# Patient Record
Sex: Female | Born: 1960 | Race: White | Hispanic: No | Marital: Married | Smoking: Never smoker
Health system: Southern US, Community
[De-identification: ages and names within clinical notes are randomized; demographics above are authoritative.]

## PROBLEM LIST (undated history)

## (undated) DIAGNOSIS — E119 Type 2 diabetes mellitus without complications: Secondary | ICD-10-CM

## (undated) DIAGNOSIS — I10 Essential (primary) hypertension: Secondary | ICD-10-CM

## (undated) DIAGNOSIS — E7800 Pure hypercholesterolemia, unspecified: Secondary | ICD-10-CM

## (undated) HISTORY — PX: CHOLECYSTECTOMY: SHX55

---

## 2000-09-30 ENCOUNTER — Inpatient Hospital Stay (HOSPITAL_COMMUNITY): Admission: EM | Admit: 2000-09-30 | Discharge: 2000-10-05 | Payer: Self-pay | Admitting: Emergency Medicine

## 2013-07-20 ENCOUNTER — Telehealth: Payer: Self-pay

## 2013-07-20 NOTE — Telephone Encounter (Signed)
Pt was referred by Dr. Sherryll Burger for screening colonoscopy. LMOM for a return call.

## 2013-07-21 ENCOUNTER — Telehealth: Payer: Self-pay

## 2013-07-21 NOTE — Telephone Encounter (Signed)
Pt was returning DS call. Please call patient at [phone]

## 2013-07-22 NOTE — Telephone Encounter (Signed)
LMOM for a return call.  

## 2013-07-27 NOTE — Telephone Encounter (Signed)
Pt left VM and I returned her call and LMOM for a call back.

## 2013-07-28 NOTE — Telephone Encounter (Signed)
See phone call of 07/20/2013.

## 2013-08-04 ENCOUNTER — Telehealth: Payer: Self-pay

## 2013-08-04 NOTE — Telephone Encounter (Signed)
LMOM for pt to call to schedule colonoscopy.

## 2013-08-20 NOTE — Telephone Encounter (Signed)
Letter to PCP

## 2015-11-27 ENCOUNTER — Encounter (HOSPITAL_COMMUNITY): Payer: Self-pay | Admitting: Emergency Medicine

## 2015-11-27 ENCOUNTER — Inpatient Hospital Stay (HOSPITAL_COMMUNITY)
Admission: EM | Admit: 2015-11-27 | Discharge: 2015-11-29 | DRG: 871 | Disposition: A | Discharge status: Home / Self Care | Payer: BLUE CROSS/BLUE SHIELD | Attending: Internal Medicine | Admitting: Internal Medicine

## 2015-11-27 ENCOUNTER — Emergency Department (HOSPITAL_COMMUNITY): Payer: BLUE CROSS/BLUE SHIELD

## 2015-11-27 DIAGNOSIS — E876 Hypokalemia: Secondary | ICD-10-CM

## 2015-11-27 DIAGNOSIS — N183 Chronic kidney disease, stage 3 (moderate): Secondary | ICD-10-CM | POA: Diagnosis present

## 2015-11-27 DIAGNOSIS — I129 Hypertensive chronic kidney disease with stage 1 through stage 4 chronic kidney disease, or unspecified chronic kidney disease: Secondary | ICD-10-CM | POA: Diagnosis present

## 2015-11-27 DIAGNOSIS — E7800 Pure hypercholesterolemia, unspecified: Secondary | ICD-10-CM | POA: Diagnosis present

## 2015-11-27 DIAGNOSIS — Z833 Family history of diabetes mellitus: Secondary | ICD-10-CM | POA: Diagnosis not present

## 2015-11-27 DIAGNOSIS — E785 Hyperlipidemia, unspecified: Secondary | ICD-10-CM | POA: Diagnosis present

## 2015-11-27 DIAGNOSIS — E1121 Type 2 diabetes mellitus with diabetic nephropathy: Secondary | ICD-10-CM | POA: Diagnosis not present

## 2015-11-27 DIAGNOSIS — A419 Sepsis, unspecified organism: Secondary | ICD-10-CM | POA: Diagnosis present

## 2015-11-27 DIAGNOSIS — Z23 Encounter for immunization: Secondary | ICD-10-CM

## 2015-11-27 DIAGNOSIS — E1165 Type 2 diabetes mellitus with hyperglycemia: Secondary | ICD-10-CM | POA: Diagnosis present

## 2015-11-27 DIAGNOSIS — I10 Essential (primary) hypertension: Secondary | ICD-10-CM | POA: Diagnosis not present

## 2015-11-27 DIAGNOSIS — E1129 Type 2 diabetes mellitus with other diabetic kidney complication: Secondary | ICD-10-CM

## 2015-11-27 DIAGNOSIS — J189 Pneumonia, unspecified organism: Secondary | ICD-10-CM

## 2015-11-27 DIAGNOSIS — Z6837 Body mass index (BMI) 37.0-37.9, adult: Secondary | ICD-10-CM

## 2015-11-27 DIAGNOSIS — R05 Cough: Secondary | ICD-10-CM | POA: Diagnosis not present

## 2015-11-27 DIAGNOSIS — E1122 Type 2 diabetes mellitus with diabetic chronic kidney disease: Secondary | ICD-10-CM | POA: Diagnosis present

## 2015-11-27 DIAGNOSIS — J9601 Acute respiratory failure with hypoxia: Secondary | ICD-10-CM | POA: Diagnosis not present

## 2015-11-27 DIAGNOSIS — Z7984 Long term (current) use of oral hypoglycemic drugs: Secondary | ICD-10-CM | POA: Diagnosis not present

## 2015-11-27 DIAGNOSIS — Z7982 Long term (current) use of aspirin: Secondary | ICD-10-CM

## 2015-11-27 DIAGNOSIS — N181 Chronic kidney disease, stage 1: Secondary | ICD-10-CM

## 2015-11-27 DIAGNOSIS — R059 Cough, unspecified: Secondary | ICD-10-CM

## 2015-11-27 HISTORY — DX: Type 2 diabetes mellitus without complications: E11.9

## 2015-11-27 HISTORY — DX: Pure hypercholesterolemia, unspecified: E78.00

## 2015-11-27 HISTORY — DX: Essential (primary) hypertension: I10

## 2015-11-27 LAB — COMPREHENSIVE METABOLIC PANEL
ALT: 16 U/L (ref 14–54)
ALT: 17 U/L (ref 14–54)
ANION GAP: 15 (ref 5–15)
AST: 20 U/L (ref 15–41)
AST: 17 U/L (ref 15–41)
Albumin: 2.9 g/dL — ABNORMAL LOW (ref 3.5–5.0)
Albumin: 2.6 g/dL — ABNORMAL LOW (ref 3.5–5.0)
Alkaline Phosphatase: 100 U/L (ref 38–126)
Alkaline Phosphatase: 93 U/L (ref 38–126)
Anion gap: 14 (ref 5–15)
BUN: 30 mg/dL — AB (ref 6–20)
BUN: 28 mg/dL — ABNORMAL HIGH (ref 6–20)
CHLORIDE: 86 mmol/L — AB (ref 101–111)
CHLORIDE: 91 mmol/L — AB (ref 101–111)
CO2: 34 mmol/L — ABNORMAL HIGH (ref 22–32)
CO2: 30 mmol/L (ref 22–32)
CREATININE: 1.42 mg/dL — AB (ref 0.44–1.00)
CREATININE: 1.31 mg/dL — AB (ref 0.44–1.00)
Calcium: 7.4 mg/dL — ABNORMAL LOW (ref 8.9–10.3)
Calcium: 7 mg/dL — ABNORMAL LOW (ref 8.9–10.3)
GFR calc Af Amer: 47 mL/min — ABNORMAL LOW (ref >60)
GFR, EST AFRICAN AMERICAN: 51 mL/min — AB (ref >60)
GFR, EST NON AFRICAN AMERICAN: 40 mL/min — AB (ref >60)
GFR, EST NON AFRICAN AMERICAN: 44 mL/min — AB (ref >60)
Glucose, Bld: 295 mg/dL — ABNORMAL HIGH (ref 65–99)
Glucose, Bld: 344 mg/dL — ABNORMAL HIGH (ref 65–99)
POTASSIUM: 2.9 mmol/L — AB (ref 3.5–5.1)
Potassium: 2.8 mmol/L — ABNORMAL LOW (ref 3.5–5.1)
SODIUM: 136 mmol/L (ref 135–145)
Sodium: 134 mmol/L — ABNORMAL LOW (ref 135–145)
Total Bilirubin: 0.6 mg/dL (ref 0.3–1.2)
Total Bilirubin: 0.8 mg/dL (ref 0.3–1.2)
Total Protein: 7.1 g/dL (ref 6.5–8.1)
Total Protein: 6.8 g/dL (ref 6.5–8.1)

## 2015-11-27 LAB — CBC
HCT: 33.1 % — ABNORMAL LOW (ref 36.0–46.0)
Hemoglobin: 10.9 g/dL — ABNORMAL LOW (ref 12.0–15.0)
MCH: 26.8 pg (ref 26.0–34.0)
MCHC: 32.9 g/dL (ref 30.0–36.0)
MCV: 81.3 fL (ref 78.0–100.0)
PLATELETS: 207 10*3/uL (ref 150–400)
RBC: 4.07 MIL/uL (ref 3.87–5.11)
RDW: 13.7 % (ref 11.5–15.5)
WBC: 22.9 10*3/uL — ABNORMAL HIGH (ref 4.0–10.5)

## 2015-11-27 LAB — BLOOD GAS, ARTERIAL
Acid-Base Excess: 6 mmol/L — ABNORMAL HIGH (ref 0.0–2.0)
Bicarbonate: 29.8 mEq/L — ABNORMAL HIGH (ref 20.0–24.0)
DRAWN BY: 25788
O2 CONTENT: 3 L/min
O2 SAT: 90.7 %
PO2 ART: 61.9 mmHg — AB (ref 80.0–100.0)
pCO2 arterial: 39 mmHg (ref 35.0–45.0)
pH, Arterial: 7.491 — ABNORMAL HIGH (ref 7.350–7.450)

## 2015-11-27 LAB — URINALYSIS, ROUTINE W REFLEX MICROSCOPIC
Bilirubin Urine: NEGATIVE
LEUKOCYTES UA: NEGATIVE
Nitrite: NEGATIVE
Protein, ur: 30 mg/dL — AB
pH: 6 (ref 5.0–8.0)

## 2015-11-27 LAB — CBC WITH DIFFERENTIAL/PLATELET
Basophils Absolute: 0 10*3/uL (ref 0.0–0.1)
Basophils Relative: 0 %
EOS ABS: 0 10*3/uL (ref 0.0–0.7)
EOS PCT: 0 %
HCT: 34.4 % — ABNORMAL LOW (ref 36.0–46.0)
Hemoglobin: 11.5 g/dL — ABNORMAL LOW (ref 12.0–15.0)
LYMPHS PCT: 3 %
Lymphs Abs: 0.5 10*3/uL — ABNORMAL LOW (ref 0.7–4.0)
MCH: 27.4 pg (ref 26.0–34.0)
MCHC: 33.4 g/dL (ref 30.0–36.0)
MCV: 82.1 fL (ref 78.0–100.0)
Monocytes Absolute: 0.3 10*3/uL (ref 0.1–1.0)
Monocytes Relative: 2 %
Neutro Abs: 17.6 10*3/uL — ABNORMAL HIGH (ref 1.7–7.7)
Neutrophils Relative %: 95 %
PLATELETS: 222 10*3/uL (ref 150–400)
RBC: 4.19 MIL/uL (ref 3.87–5.11)
RDW: 14.1 % (ref 11.5–15.5)
WBC: 18.5 10*3/uL — AB (ref 4.0–10.5)

## 2015-11-27 LAB — APTT: APTT: 30 s (ref 24–37)

## 2015-11-27 LAB — URINE MICROSCOPIC-ADD ON

## 2015-11-27 LAB — I-STAT CG4 LACTIC ACID, ED: Lactic Acid, Venous: 3.08 mmol/L (ref 0.5–2.0)

## 2015-11-27 LAB — MRSA PCR SCREENING: MRSA by PCR: NEGATIVE

## 2015-11-27 LAB — PROTIME-INR
INR: 1.31 (ref 0.00–1.49)
INR: 1.21 (ref 0.00–1.49)
PROTHROMBIN TIME: 15.5 s — AB (ref 11.6–15.2)
Prothrombin Time: 16.5 seconds — ABNORMAL HIGH (ref 11.6–15.2)

## 2015-11-27 LAB — LACTIC ACID, PLASMA
LACTIC ACID, VENOUS: 1.8 mmol/L (ref 0.5–2.0)
LACTIC ACID, VENOUS: 1.6 mmol/L (ref 0.5–2.0)

## 2015-11-27 LAB — MAGNESIUM: Magnesium: 1.8 mg/dL (ref 1.7–2.4)

## 2015-11-27 LAB — BRAIN NATRIURETIC PEPTIDE: B NATRIURETIC PEPTIDE 5: 270 pg/mL — AB (ref 0.0–100.0)

## 2015-11-27 LAB — STREP PNEUMONIAE URINARY ANTIGEN: STREP PNEUMO URINARY ANTIGEN: NEGATIVE

## 2015-11-27 LAB — GLUCOSE, CAPILLARY
GLUCOSE-CAPILLARY: 276 mg/dL — AB (ref 65–99)
GLUCOSE-CAPILLARY: 358 mg/dL — AB (ref 65–99)

## 2015-11-27 LAB — PROCALCITONIN: PROCALCITONIN: 14.69 ng/mL

## 2015-11-27 MED ORDER — METHYLPREDNISOLONE SODIUM SUCC 125 MG IJ SOLR: 125.0000 mg | Freq: Once | INTRAMUSCULAR | Status: DC

## 2015-11-27 MED ORDER — SODIUM CHLORIDE 0.9 % IV SOLN
INTRAVENOUS | Status: DC
  Administered 2015-11-27: 125 mL/h via INTRAVENOUS

## 2015-11-27 MED ORDER — INSULIN ASPART 100 UNIT/ML ~~LOC~~ SOLN
0.0000 [IU] | Freq: Every day | SUBCUTANEOUS | Status: DC
  Administered 2015-11-27 – 2015-11-28 (×2): 5 [IU] via SUBCUTANEOUS

## 2015-11-27 MED ORDER — SODIUM CHLORIDE 0.9 % IV BOLUS (SEPSIS)
1000.0000 mL | INTRAVENOUS | Status: AC
Start: 2015-11-27 — End: 2015-11-27

## 2015-11-27 MED ORDER — GLIPIZIDE ER 5 MG PO TB24
10.0000 mg | ORAL_TABLET | Freq: Every day | ORAL | Status: DC
  Administered 2015-11-27 – 2015-11-29 (×3): 10 mg via ORAL
  Filled 2015-11-27 (×3): qty 2

## 2015-11-27 MED ORDER — SODIUM CHLORIDE 0.9 % IV BOLUS (SEPSIS)
1000.0000 mL | Freq: Once | INTRAVENOUS | Status: AC
  Administered 2015-11-27: 1000 mL via INTRAVENOUS

## 2015-11-27 MED ORDER — POTASSIUM CHLORIDE CRYS ER 20 MEQ PO TBCR
20.0000 meq | EXTENDED_RELEASE_TABLET | Freq: Once | ORAL | Status: AC
  Administered 2015-11-27: 20 meq via ORAL
  Filled 2015-11-27: qty 1

## 2015-11-27 MED ORDER — METOPROLOL TARTRATE 50 MG PO TABS
100.0000 mg | ORAL_TABLET | Freq: Two times a day (BID) | ORAL | Status: DC
  Administered 2015-11-27 – 2015-11-29 (×4): 100 mg via ORAL
  Filled 2015-11-27 (×4): qty 2

## 2015-11-27 MED ORDER — LISINOPRIL 10 MG PO TABS
40.0000 mg | ORAL_TABLET | Freq: Every day | ORAL | Status: DC
  Administered 2015-11-27 – 2015-11-29 (×3): 40 mg via ORAL
  Filled 2015-11-27 (×3): qty 4

## 2015-11-27 MED ORDER — ACETAMINOPHEN 325 MG PO TABS
650.0000 mg | ORAL_TABLET | Freq: Once | ORAL | Status: AC
  Administered 2015-11-27: 650 mg via ORAL
  Filled 2015-11-27: qty 2

## 2015-11-27 MED ORDER — CALCIUM CARBONATE-VITAMIN D 500-200 MG-UNIT PO TABS
1.0000 | ORAL_TABLET | Freq: Two times a day (BID) | ORAL | Status: DC
  Administered 2015-11-27 – 2015-11-29 (×4): 1 via ORAL
  Filled 2015-11-27 (×9): qty 1

## 2015-11-27 MED ORDER — POTASSIUM CHLORIDE CRYS ER 20 MEQ PO TBCR
40.0000 meq | EXTENDED_RELEASE_TABLET | ORAL | Status: AC
  Administered 2015-11-27 (×3): 40 meq via ORAL
  Filled 2015-11-27 (×2): qty 2

## 2015-11-27 MED ORDER — SENNOSIDES-DOCUSATE SODIUM 8.6-50 MG PO TABS: 1.0000 | ORAL_TABLET | Freq: Every evening | ORAL | Status: DC | PRN

## 2015-11-27 MED ORDER — CEFTRIAXONE SODIUM 1 G IJ SOLR
1.0000 g | INTRAMUSCULAR | Status: DC
  Administered 2015-11-27 – 2015-11-28 (×2): 1 g via INTRAVENOUS
  Filled 2015-11-27 (×3): qty 10

## 2015-11-27 MED ORDER — ACETAMINOPHEN 325 MG PO TABS: 650.0000 mg | ORAL_TABLET | Freq: Four times a day (QID) | ORAL | Status: DC | PRN

## 2015-11-27 MED ORDER — PNEUMOCOCCAL VAC POLYVALENT 25 MCG/0.5ML IJ INJ: 0.5000 mL | INJECTION | INTRAMUSCULAR | Status: DC

## 2015-11-27 MED ORDER — DEXTROSE 5 % IV SOLN
500.0000 mg | Freq: Once | INTRAVENOUS | Status: AC
  Administered 2015-11-27: 500 mg via INTRAVENOUS
  Filled 2015-11-27: qty 500

## 2015-11-27 MED ORDER — ONDANSETRON HCL 4 MG PO TABS: 4.0000 mg | ORAL_TABLET | Freq: Four times a day (QID) | ORAL | Status: DC | PRN

## 2015-11-27 MED ORDER — DEXTROSE 5 % IV SOLN
1.0000 g | Freq: Once | INTRAVENOUS | Status: AC
  Administered 2015-11-27: 1 g via INTRAVENOUS
  Filled 2015-11-27: qty 10

## 2015-11-27 MED ORDER — IPRATROPIUM-ALBUTEROL 0.5-2.5 (3) MG/3ML IN SOLN
3.0000 mL | Freq: Once | RESPIRATORY_TRACT | Status: AC
  Administered 2015-11-27: 3 mL via RESPIRATORY_TRACT
  Filled 2015-11-27: qty 3

## 2015-11-27 MED ORDER — SODIUM CHLORIDE 0.9 % IV SOLN
INTRAVENOUS | Status: DC
  Administered 2015-11-27: 800 mL via INTRAVENOUS

## 2015-11-27 MED ORDER — DEXTROSE 5 % IV SOLN
500.0000 mg | INTRAVENOUS | Status: DC
  Administered 2015-11-28: 500 mg via INTRAVENOUS
  Filled 2015-11-27 (×3): qty 500

## 2015-11-27 MED ORDER — ACETAMINOPHEN 650 MG RE SUPP: 650.0000 mg | Freq: Four times a day (QID) | RECTAL | Status: DC | PRN

## 2015-11-27 MED ORDER — INSULIN ASPART 100 UNIT/ML ~~LOC~~ SOLN
4.0000 [IU] | Freq: Three times a day (TID) | SUBCUTANEOUS | Status: DC
  Administered 2015-11-28 – 2015-11-29 (×5): 4 [IU] via SUBCUTANEOUS

## 2015-11-27 MED ORDER — ENOXAPARIN SODIUM 40 MG/0.4ML ~~LOC~~ SOLN
40.0000 mg | SUBCUTANEOUS | Status: DC
  Administered 2015-11-27 – 2015-11-28 (×2): 40 mg via SUBCUTANEOUS
  Filled 2015-11-27 (×2): qty 0.4

## 2015-11-27 MED ORDER — CANAGLIFLOZIN 300 MG PO TABS: 300.0000 mg | ORAL_TABLET | Freq: Every day | ORAL | Status: DC

## 2015-11-27 MED ORDER — ONDANSETRON HCL 4 MG/2ML IJ SOLN: 4.0000 mg | Freq: Four times a day (QID) | INTRAMUSCULAR | Status: DC | PRN

## 2015-11-27 MED ORDER — INSULIN ASPART 100 UNIT/ML ~~LOC~~ SOLN
0.0000 [IU] | Freq: Three times a day (TID) | SUBCUTANEOUS | Status: DC
  Administered 2015-11-27: 8 [IU] via SUBCUTANEOUS
  Administered 2015-11-28: 5 [IU] via SUBCUTANEOUS
  Administered 2015-11-28: 15 [IU] via SUBCUTANEOUS
  Administered 2015-11-28 – 2015-11-29 (×2): 5 [IU] via SUBCUTANEOUS

## 2015-11-27 MED ORDER — DEXTROSE 5 % IV SOLN
INTRAVENOUS | Status: AC
  Filled 2015-11-27: qty 10

## 2015-11-27 MED ORDER — ATORVASTATIN CALCIUM 10 MG PO TABS
10.0000 mg | ORAL_TABLET | Freq: Every day | ORAL | Status: DC
  Administered 2015-11-27 – 2015-11-29 (×3): 10 mg via ORAL
  Filled 2015-11-27 (×3): qty 1

## 2015-11-27 MED ORDER — SODIUM CHLORIDE 0.9 % IV BOLUS (SEPSIS)
1000.0000 mL | INTRAVENOUS | Status: AC
  Administered 2015-11-27 (×3): 1000 mL via INTRAVENOUS

## 2015-11-27 MED ORDER — AMLODIPINE BESYLATE 5 MG PO TABS
10.0000 mg | ORAL_TABLET | Freq: Every day | ORAL | Status: DC
  Administered 2015-11-27 – 2015-11-29 (×3): 10 mg via ORAL
  Filled 2015-11-27 (×3): qty 2

## 2015-11-27 MED ORDER — ASPIRIN EC 81 MG PO TBEC
81.0000 mg | DELAYED_RELEASE_TABLET | Freq: Every day | ORAL | Status: DC
  Administered 2015-11-27 – 2015-11-29 (×3): 81 mg via ORAL
  Filled 2015-11-27 (×3): qty 1

## 2015-11-27 MED ORDER — INFLUENZA VAC SPLIT QUAD 0.5 ML IM SUSY: 0.5000 mL | PREFILLED_SYRINGE | INTRAMUSCULAR | Status: DC

## 2015-11-27 MED ORDER — HYDROCHLOROTHIAZIDE 25 MG PO TABS
25.0000 mg | ORAL_TABLET | Freq: Every day | ORAL | Status: DC
  Administered 2015-11-27 – 2015-11-29 (×3): 25 mg via ORAL
  Filled 2015-11-27 (×3): qty 1

## 2015-11-27 NOTE — ED Notes (Signed)
Patient brought in via EMS from home. Alert and oriented. Airway patent. Patient c/o shortness of breath with cough, body aches, and fevers. Patient reports vomiting once but states she was never nauseated just coughing. Patient given  solumedrol IV, duoneb, and bolus in route to hospital by EMS. EMS reports patient'slood sugar as 324. Per patient grandson is recently recovering from flu. EMS reports patient showing A-fib on monitor, in which patient denies hx of A-fib.

## 2015-11-27 NOTE — ED Notes (Signed)
Repeat lactic not due at this time.

## 2015-11-27 NOTE — ED Notes (Signed)
Respiratory notified of need for peak flow check and Duoneb.  No peak flow meters available per Heather.

## 2015-11-27 NOTE — ED Notes (Signed)
NT states that Lactic acid was ran initially but machines are having trouble crossing over.  Test repeated.

## 2015-11-27 NOTE — H&P (Signed)
[facility]          History and Physical    PCP:   No primary care provider on file.   EDP: Pamela Fry, M.D.  Chief Complaint:  Shortness of breath, cough  HPI: Patient is a 55 year old woman with history of hypertension, type 2 diabetes, hyperlipidemia, morbid obesity who presents to the hospital today with a week and a half duration of shortness of breath and cough. She decided to come to the hospital today because this has not improved. Through the course of her illness she has also had fevers and chills although she has not actually taken her temperature. 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 She has not traveled out of state recently. In the emergency department on chest x-ray she was found to have a consolidated right middle lobe pneumonia. She is found to be hypoxemic with O2 sats of 87% on room air. Labs show a potassium of 2.8, a creatinine of 1.4 which appears to be close to her baseline, a lactic acid that is elevated at 3.08 and a WBC count of 22.9. Admission has been requested for further evaluation and management.  Allergies:  No Known Allergies    Past Medical History  Diagnosis Date  . Hypertension   . Diabetes mellitus without complication ([place])   . High cholesterol     Past Surgical History  Procedure Laterality Date  . Cesarean section    . Cholecystectomy      Prior to Admission medications   Medication Sig Start Date End Date Taking? Authorizing Provider  amLODipine (NORVASC) 10 MG tablet Take 1 tablet by mouth daily. 11/16/15  Yes Historical Provider, MD  aspirin EC 81 MG tablet Take 81 mg by mouth daily.   Yes Historical Provider, MD  atorvastatin (LIPITOR) 10 MG tablet Take 1 tablet by mouth daily. 11/18/15  Yes Historical Provider, MD  calcium-vitamin D (OSCAL WITH D) 500-200 MG-UNIT tablet Take 1 tablet by mouth 2 (two) times daily.   Yes Historical Provider, MD  glipiZIDE  (GLUCOTROL XL) 10 MG 24 hr tablet Take 1 tablet by mouth daily. 11/16/15  Yes Historical Provider, MD  hydrochlorothiazide (HYDRODIURIL) 25 MG tablet Take 1 tablet by mouth daily. 11/16/15  Yes Historical Provider, MD  INVOKANA 300 MG TABS tablet Take 1 tablet by mouth daily. 11/18/15  Yes Historical Provider, MD  lisinopril (PRINIVIL,ZESTRIL) 40 MG tablet Take 1 tablet by mouth daily. 11/16/15  Yes Historical Provider, MD  metFORMIN (GLUCOPHAGE) 500 MG tablet Take 2 tablets by mouth 2 (two) times daily. 11/16/15  Yes Historical Provider, MD  metoprolol (LOPRESSOR) 100 MG tablet Take 1 tablet by mouth 2 (two) times daily. 11/16/15  Yes Historical Provider, MD  VICTOZA 18 MG/3ML SOPN Inject 1.8 mg into the skin daily. 11/16/15  Yes Historical Provider, MD    Social History:  reports that she has never smoked. She has never used smokeless tobacco. She reports that she does not drink alcohol or use illicit drugs.  Family History  Problem Relation Age of Onset  . Diabetes Mother     Review of Systems:  Constitutional: Denies  diaphoresis, appetite change and fatigue.  HEENT: Denies photophobia, eye pain, redness, hearing loss, ear pain, congestion, sore throat, rhinorrhea, sneezing, mouth sores, trouble swallowing, neck pain, neck stiffness and tinnitus.   Respiratory: Denies, chest tightness,  and wheezing.  Cardiovascular: Denies chest pain, palpitations and leg swelling.  Gastrointestinal: Denies nausea, vomiting, abdominal pain, diarrhea, constipation, blood in stool and abdominal distention.  Genitourinary: Denies dysuria, urgency, frequency, hematuria, flank pain and difficulty urinating.  Endocrine: Denies: hot or cold intolerance, sweats, changes in hair or nails, polyuria, polydipsia. Musculoskeletal: Denies myalgias, back pain, joint swelling, arthralgias and gait problem.  Skin: Denies pallor, rash and wound.  Neurological: Denies dizziness, seizures, syncope, weakness, light-headedness,  numbness and headaches.  Hematological: Denies adenopathy. Easy bruising, personal or family bleeding history  Psychiatric/Behavioral: Denies suicidal ideation, mood changes, confusion, nervousness, sleep disturbance and agitation   Physical Exam: Blood pressure 132/56, pulse 110, temperature 98.2 F (36.8 C), temperature source Oral, resp. rate 20, height 5' (1.524 m), weight 86.183 kg (190 lb), SpO2 98 %. General: Alert, awake, oriented 3 HEENT: Normocephalic, atraumatic, pupils equal round and reactive to light, extraocular movements intact, poor dentition, dry mucous membranes Neck: Supple, no JVD, no lymphadenopathy, no bruits, no goiter Cardiovascular: Regular rate and rhythm, no murmurs, rubs or gallops Lungs: Right mid lung field crackles and rails, no wheezes Abdomen: Obese, soft, nontender, nondistended, positive bowel sounds Extremities: 1+ pitting edema bilaterally Neurologic: Grossly intact and nonfocal  Labs on Admission:  Results for orders placed or performed during the hospital encounter of 11/27/15 (from the past 48 hour(s))  Comprehensive metabolic panel     Status: Abnormal   Collection Time: 11/27/15  9:25 AM  Result Value Ref Range   Sodium 134 (L) 135 - 145 mmol/L   Potassium 2.8 (L) 3.5 - 5.1 mmol/L   Chloride 86 (L) 101 - 111 mmol/L   CO2 34 (H) 22 - 32 mmol/L   Glucose, Bld 295 (H) 65 - 99 mg/dL   BUN 30 (H) 6 - 20 mg/dL   Creatinine, Ser 1.42 (H) 0.44 - 1.00 mg/dL   Calcium 7.4 (L) 8.9 - 10.3 mg/dL   Total Protein 7.1 6.5 - 8.1 g/dL   Albumin 2.9 (L) 3.5 - 5.0 g/dL   AST 20 15 - 41 U/L   ALT 16 14 - 54 U/L   Alkaline Phosphatase 100 38 - 126 U/L   Total Bilirubin 0.6 0.3 - 1.2 mg/dL   GFR calc non Af Amer 40 (L) >60 mL/min   GFR calc Af Amer 47 (L) >60 mL/min    Comment: (NOTE) The eGFR has been calculated using the CKD EPI equation. This calculation has not been validated in all clinical situations. eGFR's persistently <60 mL/min signify possible  Chronic Kidney Disease.    Anion gap 14 5 - 15  Brain natriuretic peptide (only with dyspnea)     Status: Abnormal   Collection Time: 11/27/15  9:25 AM  Result Value Ref Range   B Natriuretic Peptide 270.0 (H) 0.0 - 100.0 pg/mL  CBC     Status: Abnormal   Collection Time: 11/27/15  9:25 AM  Result Value Ref Range   WBC 22.9 (H) 4.0 - 10.5 K/uL   RBC 4.07 3.87 - 5.11 MIL/uL   Hemoglobin 10.9 (L) 12.0 - 15.0 g/dL   HCT 33.1 (L) 36.0 - 46.0 %   MCV 81.3 78.0 - 100.0 fL   MCH 26.8 26.0 - 34.0 pg   MCHC 32.9 30.0 - 36.0 g/dL   RDW 13.7 11.5 - 15.5 %   Platelets 207 150 - 400 K/uL  Protime-INR     Status: Abnormal   Collection Time: 11/27/15  9:25 AM  Result Value Ref Range   Prothrombin  Time 16.5 (H) 11.6 - 15.2 seconds   INR 1.31 0.00 - 1.49  Blood Culture (routine x 2)     Status: None (Preliminary result)   Collection Time: 11/27/15 11:11 AM  Result Value Ref Range   Specimen Description BLOOD    Special Requests NONE    Culture NO GROWTH < 12 HOURS    Report Status PENDING   Blood Culture (routine x 2)     Status: None (Preliminary result)   Collection Time: 11/27/15 11:14 AM  Result Value Ref Range   Specimen Description BLOOD    Special Requests NONE    Culture NO GROWTH < 12 HOURS    Report Status PENDING   Urinalysis, Routine w reflex microscopic (not at [facility])     Status: Abnormal   Collection Time: 11/27/15 11:19 AM  Result Value Ref Range   Color, Urine YELLOW YELLOW   APPearance CLOUDY (A) CLEAR   Specific Gravity, Urine <1.005 (L) 1.005 - 1.030   pH 6.0 5.0 - 8.0   Glucose, UA >1000 (A) NEGATIVE mg/dL   Hgb urine dipstick MODERATE (A) NEGATIVE   Bilirubin Urine NEGATIVE NEGATIVE   Ketones, ur TRACE (A) NEGATIVE mg/dL   Protein, ur 30 (A) NEGATIVE mg/dL   Nitrite NEGATIVE NEGATIVE   Leukocytes, UA NEGATIVE NEGATIVE  Urine microscopic-add on     Status: Abnormal   Collection Time: 11/27/15 11:19 AM  Result Value Ref Range   Squamous Epithelial / LPF 6-30  (A) NONE SEEN   WBC, UA 0-5 0 - 5 WBC/hpf   RBC / HPF 0-5 0 - 5 RBC/hpf   Bacteria, UA FEW (A) NONE SEEN   Urine-Other YEAST PRESENT   I-Stat CG4 Lactic Acid, ED     Status: Abnormal   Collection Time: 11/27/15  1:31 PM  Result Value Ref Range   Lactic Acid, Venous 3.08 (HH) 0.5 - 2.0 mmol/L   Comment NOTIFIED PHYSICIAN   Blood gas, arterial ([facility] & [facility] ONLY)     Status: Abnormal   Collection Time: 11/27/15  2:10 PM  Result Value Ref Range   O2 Content 3.0 L/min   pH, Arterial 7.491 (H) 7.350 - 7.450   pCO2 arterial 39.0 35.0 - 45.0 mmHg   pO2, Arterial 61.9 (L) 80.0 - 100.0 mmHg   Bicarbonate 29.8 (H) 20.0 - 24.0 mEq/L   Acid-Base Excess 6.0 (H) 0.0 - 2.0 mmol/L   O2 Saturation 90.7 %   Collection site RIGHT RADIAL    Drawn by [id number]    Sample type ARTERIAL    Allens test (pass/fail) PASS PASS  Glucose, capillary     Status: Abnormal   Collection Time: 11/27/15  3:31 PM  Result Value Ref Range   Glucose-Capillary 276 (H) 65 - 99 mg/dL    Radiological Exams on Admission: Dg Chest Port 1 View  11/27/2015  CLINICAL DATA:  Shortness of breath with cough and fever EXAM: PORTABLE CHEST 1 VIEW COMPARISON:  None. FINDINGS: There is extensive airspace consolidation throughout the right mid lung. Left lung clear. Heart is enlarged with pulmonary vascular within normal limits. No adenopathy evident. No bone lesion. IMPRESSION: Large focus of airspace consolidation in the right mid lung region. Suspect pneumonia, although underlying mass lesion cannot be excluded. Lungs elsewhere clear. Heart enlarged. Followup PA and lateral chest radiographs recommended in 3-4 weeks following trial of antibiotic therapy to ensure resolution and exclude underlying malignancy. Electronically Signed   By: Lowella Grip III M.D.   On: 11/27/2015  10:03    Assessment/Plan Principal Problem:   CAP (community acquired pneumonia) Active Problems:   Sepsis ([place])   Acute hypoxemic respiratory failure ([place])    Morbid obesity ([place])   HTN (hypertension)   DM type 2 causing renal disease ([place])   Hyperlipidemia   Hypokalemia    Sepsis -Secondary to community-acquired pneumonia -We'll give fluids as per sepsis protocol, recheck lactic acid and pro-calcitonin as per protocol as well. -Please see below for details.  Community-acquired pneumonia -Obtain sputum/blood cultures. -Check strep pneumo/legionella urinary antigens. -Start on Rocephin and azithromycin for community-acquired coverage.  Acute hypoxemic respiratory failure -With O2 saturations initially of 87% on room air, she is oxygen nave. -Plan to continue oxygen as needed. -As tolerated. -Secondary to pneumonia.  Morbid obesity -Noted  Type 2 diabetes -Check hemoglobin A1c. -Continue oral hypoglycemics with the exception of metformin which will be held, sliding scale insulin.  Hypokalemia -We'll replete orally, check magnesium level and replace as necessary.  Chronic kidney disease stage III -Likely related to history of diabetes and hypertension. -Creatinine is at baseline of 1.2-1.4.  DVT prophylaxis -Lovenox  CODE STATUS -Full code    Time Spent on Admission: 95 minutes  [facility] Pager: [phone] 11/27/2015, 4:18 PM

## 2015-11-27 NOTE — ED Provider Notes (Addendum)
CSN: [id number]     Arrival date & time 11/27/15  7829 History  By signing my name below, I, Arianna Nassar, attest that this documentation has been prepared under the direction and in the presence of Margarita Grizzle, MD. Electronically Signed: Octavia Heir, ED Scribe. 11/27/2015. 9:36 AM.     Chief Complaint  Patient presents with  . Shortness of Breath      Patient is a 55 y.o. female presenting with shortness of breath. The history is provided by the patient. No language interpreter was used.  Shortness of Breath Severity:  Moderate Onset quality:  Sudden Duration:  3 days Timing:  Constant Progression:  Worsening Chronicity:  New Context: URI   Relieved by:  Nothing Worsened by:  Nothing tried Ineffective treatments:  Inhaler Associated symptoms: cough and wheezing   Associated symptoms: no fever and no sore throat    HPI Comments: Pamela Fry is a 55 y.o. female who  HTN, high cholesterol and DM presents to the Emergency Department complaining of constant, gradual worsening, moderate, cough with associated shortness of breath, chills, generalized body aches, congestion, and loss of appetite onset 3 days ago. She takes lisinopril, metformin, aspirin and other medications daily. She has been around her grandson who has the flu. Denies fever and sore throat. Pt did not receive her flu shot this year. Past Medical History  Diagnosis Date  . Hypertension   . Diabetes mellitus without complication ([facility])   . High cholesterol    Past Surgical History  Procedure Laterality Date  . Cesarean section    . Cholecystectomy     Family History  Problem Relation Age of Onset  . Diabetes Mother    Social History  Substance Use Topics  . Smoking status: Never Smoker   . Smokeless tobacco: Never Used  . Alcohol Use: No   OB History    Gravida Para Term Preterm AB TAB SAB Ectopic Multiple Living   Review of Systems  Constitutional: Negative for fever.  HENT:  Negative for sore throat.   Respiratory: Positive for cough, shortness of breath and wheezing.       Allergies  Review of patient's allergies indicates no known allergies.  Home Medications   Prior to Admission medications   Not on File   Triage vitals: BP 135/58 mmHg  Pulse 128  Temp(Src) 97.7 F (36.5 C) (Oral)  Resp 28  Ht 5' (1.524 m)  Wt 190 lb (86.183 kg)  BMI 37.11 kg/m2  SpO2 85% Physical Exam  Constitutional: She is oriented to person, place, and time. She appears well-developed and well-nourished.  HENT:  Head: Normocephalic and atraumatic.  Right Ear: Tympanic membrane and external ear normal.  Left Ear: Tympanic membrane and external ear normal.  Nose: Nose normal. Right sinus exhibits no maxillary sinus tenderness and no frontal sinus tenderness. Left sinus exhibits no maxillary sinus tenderness and no frontal sinus tenderness.  Eyes: Conjunctivae and EOM are normal. Pupils are equal, round, and reactive to light. Right eye exhibits no nystagmus. Left eye exhibits no nystagmus.  Neck: Normal range of motion. Neck supple.  Cardiovascular: Normal heart sounds and intact distal pulses.  An irregular rhythm present. Tachycardia present.   Pulmonary/Chest: Effort normal. No respiratory distress. She has wheezes. She has rhonchi. She exhibits no tenderness.  Decent air movement, rhonchi with expiratory wheezes  Abdominal: Soft. Bowel sounds are normal. She exhibits no distension and  no mass. There is no tenderness.  Musculoskeletal: Normal range of motion. She exhibits no edema or tenderness.  Neurological: She is alert and oriented to person, place, and time. She has normal strength and normal reflexes. No sensory deficit. She displays a negative Romberg sign. GCS eye subscore is 4. GCS verbal subscore is 5. GCS motor subscore is 6.  Reflex Scores:      Tricep reflexes are 2+ on the right side and 2+ on the left side.      Bicep reflexes are 2+ on the right side and 2+  on the left side.      Brachioradialis reflexes are 2+ on the right side and 2+ on the left side.      Patellar reflexes are 2+ on the right side and 2+ on the left side.      Achilles reflexes are 2+ on the right side and 2+ on the left side. Patient with normal gait without ataxia, shuffling, spasm, or antalgia. Speech is normal without dysarthria, dysphasia, or aphasia. Muscle strength is 5/5 in bilateral shoulders, elbow flexor and extensors, wrist flexor and extensors, and intrinsic hand muscles. 5/5 bilateral lower extremity hip flexors, extensors, knee flexors and extensors, and ankle dorsi and plantar flexors.    Skin: Skin is warm and dry. No rash noted.  Psychiatric: She has a normal mood and affect. Her behavior is normal. Judgment and thought content normal.  Nursing note and vitals reviewed.   ED Course  Procedures  DIAGNOSTIC STUDIES: Oxygen Saturation is 85% on RA, low by my interpretation.  COORDINATION OF CARE:  9:34 AM Discussed treatment plan with pt at bedside and pt agreed to plan.  Labs Review Labs Reviewed  COMPREHENSIVE METABOLIC PANEL - Abnormal; Notable for the following:    Sodium 134 (*)    Potassium 2.8 (*)    Chloride 86 (*)    CO2 34 (*)    Glucose, Bld 295 (*)    BUN 30 (*)    Creatinine, Ser 1.42 (*)    Calcium 7.4 (*)    Albumin 2.9 (*)    GFR calc non Af Amer 40 (*)    GFR calc Af Amer 47 (*)    All other components within normal limits  BRAIN NATRIURETIC PEPTIDE - Abnormal; Notable for the following:    B Natriuretic Peptide 270.0 (*)    All other components within normal limits  CBC - Abnormal; Notable for the following:    WBC 22.9 (*)    Hemoglobin 10.9 (*)    HCT 33.1 (*)    All other components within normal limits  PROTIME-INR - Abnormal; Notable for the following:    Prothrombin Time 16.5 (*)    All other components within normal limits  CULTURE, BLOOD (ROUTINE X 2)  CULTURE, BLOOD (ROUTINE X 2)  URINALYSIS, ROUTINE W REFLEX  MICROSCOPIC (NOT AT [facility])  I-STAT TROPOININ, ED  I-STAT CG4 LACTIC ACID, ED  I-STAT CG4 LACTIC ACID, ED    Imaging Review Dg Chest Port 1 View  11/27/2015  CLINICAL DATA:  Shortness of breath with cough and fever EXAM: PORTABLE CHEST 1 VIEW COMPARISON:  None. FINDINGS: There is extensive airspace consolidation throughout the right mid lung. Left lung clear. Heart is enlarged with pulmonary vascular within normal limits. No adenopathy evident. No bone lesion. IMPRESSION: Large focus of airspace consolidation in the right mid lung region. Suspect pneumonia, although underlying mass lesion cannot be excluded. Lungs elsewhere clear. Heart enlarged. Followup PA and  lateral chest radiographs recommended in 3-4 weeks following trial of antibiotic therapy to ensure resolution and exclude underlying malignancy. Electronically Signed   By: Bretta Bang III M.D.   On: 11/27/2015 10:03   I have personally reviewed and evaluated these images and lab results as part of my medical decision-making.   EKG Interpretation   Date/Time:  Sunday November 27 2015 09:17:13 EST Ventricular Rate:  125 PR Interval:    QRS Duration: 108 QT Interval:  377 QTC Calculation: 544 R Axis:   116 Text Interpretation:  Atrial fibrillation Right axis deviation Low  voltage, precordial leads Borderline repolarization abnormality Prolonged  QT interval Confirmed by Wynelle Dreier MD, Braiden Rodman ([id number]) on 11/27/2015 10:44:16 AM      MDM  1- cap-patient presented with cough, dyspnea, body aches, and chills. Chest x-Montell Leopard shows airspace  consolidation in the right midlung consistent with pneumonia. She is treated here for community acquired pneumonia with Rocephin and Zithromax. She has also been tachycardic with the new onset of A. fib at as having sepsis evaluation. 2-new onset a fib 3- dm with hyperglycemia.Anion gap normal at 14  I personally performed the services described in this documentation, which was scribed in my presence.  The recorded information has been reviewed and considered.  Discussed with Dr. Ardyth Harps and plan admission to stepdown unit  I personally performed the services described in this documentation, which was scribed in my presence. The recorded information has been reviewed and considered.  Sepsis - Repeat Assessment  Performed at:    2:09 PM   Vitals     Blood pressure 127/63, pulse 103, temperature 97.7 F (36.5 C), temperature source Oral, resp. rate 26, height 5' (1.524 m), weight 86.183 kg, SpO2 89 %.  Heart:     Irregular rate and rhythm and Tachycardic  Lungs:    Rhonchi  Capillary Refill:   <2 sec  Peripheral Pulse:   Radial pulse palpable  Skin:     Normal Color   CRITICAL CARE Performed by: Huldah Marin S Total critical care time: 75 minutes Critical care time was exclusive of separately billable procedures and treating other patients. Critical care was necessary to treat or prevent imminent or life-threatening deterioration. Critical care was time spent personally by me on the following activities: development of treatment plan with patient and/or surrogate as well as nursing, discussions with consultants, evaluation of patient's response to treatment, examination of patient, obtaining history from patient or surrogate, ordering and performing treatments and interventions, ordering and review of laboratory studies, ordering and review of radiographic studies, pulse oximetry and re-evaluation of patient's condition.    Margarita Grizzle, MD 11/27/15 1409  Margarita Grizzle, MD 11/27/15 1409

## 2015-11-28 DIAGNOSIS — A419 Sepsis, unspecified organism: Principal | ICD-10-CM

## 2015-11-28 DIAGNOSIS — I10 Essential (primary) hypertension: Secondary | ICD-10-CM

## 2015-11-28 LAB — GLUCOSE, CAPILLARY
GLUCOSE-CAPILLARY: 255 mg/dL — AB (ref 65–99)
GLUCOSE-CAPILLARY: 382 mg/dL — AB (ref 65–99)
Glucose-Capillary: 202 mg/dL — ABNORMAL HIGH (ref 65–99)
Glucose-Capillary: 356 mg/dL — ABNORMAL HIGH (ref 65–99)

## 2015-11-28 LAB — COMPREHENSIVE METABOLIC PANEL
ALBUMIN: 2.5 g/dL — AB (ref 3.5–5.0)
ALT: 13 U/L — ABNORMAL LOW (ref 14–54)
ANION GAP: 9 (ref 5–15)
AST: 15 U/L (ref 15–41)
Alkaline Phosphatase: 83 U/L (ref 38–126)
BUN: 34 mg/dL — ABNORMAL HIGH (ref 6–20)
CHLORIDE: 100 mmol/L — AB (ref 101–111)
CO2: 29 mmol/L (ref 22–32)
Calcium: 7.2 mg/dL — ABNORMAL LOW (ref 8.9–10.3)
Creatinine, Ser: 1 mg/dL (ref 0.44–1.00)
GFR calc Af Amer: >60 mL/min (ref >60)
GFR calc non Af Amer: >60 mL/min (ref >60)
GLUCOSE: 232 mg/dL — AB (ref 65–99)
POTASSIUM: 3.4 mmol/L — AB (ref 3.5–5.1)
SODIUM: 138 mmol/L (ref 135–145)
TOTAL PROTEIN: 6.9 g/dL (ref 6.5–8.1)
Total Bilirubin: 0.7 mg/dL (ref 0.3–1.2)

## 2015-11-28 LAB — INFLUENZA PANEL BY PCR (TYPE A & B)
H1N1 flu by pcr: NOT DETECTED
Influenza A By PCR: NEGATIVE
Influenza B By PCR: NEGATIVE

## 2015-11-28 LAB — LEGIONELLA ANTIGEN, URINE

## 2015-11-28 MED ORDER — INSULIN GLARGINE 100 UNIT/ML ~~LOC~~ SOLN
5.0000 [IU] | Freq: Every day | SUBCUTANEOUS | Status: DC
  Administered 2015-11-28: 5 [IU] via SUBCUTANEOUS
  Filled 2015-11-28 (×2): qty 0.05

## 2015-11-28 MED ORDER — POTASSIUM CHLORIDE CRYS ER 20 MEQ PO TBCR
40.0000 meq | EXTENDED_RELEASE_TABLET | Freq: Once | ORAL | Status: AC
  Administered 2015-11-28: 40 meq via ORAL
  Filled 2015-11-28: qty 2

## 2015-11-28 NOTE — Progress Notes (Signed)
Report called to [place] . No distress noted. Taken room 304 via wheelchair.

## 2015-11-28 NOTE — Care Management Note (Addendum)
Case Management Note  Patient Details  Name: Pamela Fry MRN: [id number] Date of Birth: [birth year]  Subjective/Objective:                  Pt admitted with CAP. Pt is from home, lives with son and is ind with ADL's. Pt is employed full time. Pt has PCP, Dr. Clelia Croft. Pt uses [facility] as pharmacy Pt has no HH services or DME prior to admission, not needed at this time.   Action/Plan: No CM needs.   Expected Discharge Date:  11/30/15               Expected Discharge Plan:  Home/Self Care  In-House Referral:  NA  Discharge planning Services     Post Acute Care Choice:  NA Choice offered to:  NA  DME Arranged:    DME Agency:     HH Arranged:    HH Agency:     Status of Service:  Completed, signed off  Medicare Important Message Given:    Date Medicare IM Given:    Medicare IM give by:    Date Additional Medicare IM Given:    Additional Medicare Important Message give by:     If discussed at Long Length of Stay Meetings, dates discussed:    Additional Comments:  Malcolm Metro, RN 11/28/2015, 3:30 PM

## 2015-11-28 NOTE — Progress Notes (Signed)
[facility] PROGRESS NOTE  Pamela Fry VHQ:[id number] DOB: 1961-03-13 DOA: 11/27/2015 PCP: No primary care provider on file.  Assessment/Plan: Sepsis -Sepsis parameters improved. -BP stable. -Lactic acid 1.6.  CAP -Feels improved. -Continue rocephin/azithro. -CBC pending to follow leukocytosis. -Strep pneumo urine Ag negative. -Cx data is still pending.  Acute Hypoxemic Respiratory Failure -2/2 CAP. -improved. -Continue to wean oxygen as tolerated.  Morbid Obesity -Noted.  DM II -Uncontrolled. -Start low dose levemir. -A1C pending.  Hypokalemia -Improved. -Continue to replete. -Mg ok at 1.8.  CKD Stage III -Better than baseline of 1.2-1.4.  Code Status: Full Code Family Communication: Patient only  Disposition Plan: Transfer to floor   Consultants:  None   Antibiotics:  Rocephin  Azithro   Subjective: Feels much improved. No CP. Pleasant and cooperative.  Objective: Filed Vitals:   11/28/15 0300 11/28/15 0400 11/28/15 0500 11/28/15 0756  BP:   120/65   Pulse: 63 78 82   Temp:  97 F (36.1 C)  98.2 F (36.8 C)  TempSrc:  Oral    Resp: Height:      Weight:   100.5 kg (221 lb 9 oz)   SpO2: 92% 92% 94%     Intake/Output Summary (Last 24 hours) at 11/28/15 1009 Last data filed at 11/28/15 0000  Gross per 24 hour  Intake 808.75 ml  Output    400 ml  Net 408.75 ml   Filed Weights   11/27/15 0918 11/28/15 0500  Weight: 86.183 kg (190 lb) 100.5 kg (221 lb 9 oz)    Exam:   General:  AA Ox3  Cardiovascular: RRR  Respiratory: Crackles mid right lung fields  Abdomen: S/NT/ND/+BS  Extremities: no C/C/E   Neurologic:  Grossly intact and non-focal  Data Reviewed: Basic Metabolic Panel:  Recent Labs Lab 11/27/15 0925 11/27/15 1706 11/28/15 0504  NA 134* 136 138  K 2.8* 2.9* 3.4*  CL 86* 91* 100*  CO2 34* 30 29  GLUCOSE 295* 344* 232*  BUN 30* 28* 34*  CREATININE 1.42* 1.31* 1.00  CALCIUM 7.4* 7.0*  7.2*  MG  --  1.8  --    Liver Function Tests:  Recent Labs Lab 11/27/15 0925 11/27/15 1706 11/28/15 0504  AST ALT 16 17 13*  ALKPHOS 100 93 83  BILITOT 0.6 0.8 0.7  PROT 7.1 6.8 6.9  ALBUMIN 2.9* 2.6* 2.5*   No results for input(s): LIPASE, AMYLASE in the last 168 hours. No results for input(s): AMMONIA in the last 168 hours. CBC:  Recent Labs Lab 11/27/15 0925 11/27/15 1706  WBC 22.9* 18.5*  NEUTROABS  --  17.6*  HGB 10.9* 11.5*  HCT 33.1* 34.4*  MCV 81.3 82.1  PLT 207 222   Cardiac Enzymes: No results for input(s): CKTOTAL, CKMB, CKMBINDEX, TROPONINI in the last 168 hours. BNP (last 3 results)  Recent Labs  11/27/15 0925  BNP 270.0*    ProBNP (last 3 results) No results for input(s): PROBNP in the last 8760 hours.  CBG:  Recent Labs Lab 11/27/15 1531 11/27/15 2158 11/28/15 0724  GLUCAP 276* 358* 202*    Recent Results (from the past 240 hour(s))  Blood Culture (routine x 2)     Status: None   Collection Time: 11/27/15 11:11 AM  Result Value Ref Range Status   Specimen Description BLOOD  Final   Special Requests NONE  Final   Culture  Setup Time   Final  GRAM VARIABLE ROD AEROBIC BOTTLE ONLY CRITICAL RESULT CALLED TO, READ BACK BY AND VERIFIED WITHJoneen Boers [id number] [facility] Performed at [facility]    Culture NO GROWTH < 12 HOURS  Final   Report Status 11/28/2015 FINAL  Final  Blood Culture (routine x 2)     Status: None   Collection Time: 11/27/15 11:14 AM  Result Value Ref Range Status   Specimen Description BLOOD  Final   Special Requests NONE  Final   Culture  Setup Time   Final    GRAM VARIABLE ROD ANAEROBIC BOTTLE ONLY CRITICAL RESULT CALLED TO, READ BACK BY AND VERIFIED WITHJoneen Boers [id number] [address] Performed at [facility]    Culture NO GROWTH < 12 HOURS  Final   Report Status 11/28/2015 FINAL  Final  MRSA PCR Screening     Status: None   Collection Time: 11/27/15  3:50 PM    Result Value Ref Range Status   MRSA by PCR NEGATIVE NEGATIVE Final    Comment:        The GeneXpert MRSA Assay (FDA approved for NASAL specimens only), is one component of a comprehensive MRSA colonization surveillance program. It is not intended to diagnose MRSA infection nor to guide or monitor treatment for MRSA infections.      Studies: Dg Chest Port 1 View  11/27/2015  CLINICAL DATA:  Shortness of breath with cough and fever EXAM: PORTABLE CHEST 1 VIEW COMPARISON:  None. FINDINGS: There is extensive airspace consolidation throughout the right mid lung. Left lung clear. Heart is enlarged with pulmonary vascular within normal limits. No adenopathy evident. No bone lesion. IMPRESSION: Large focus of airspace consolidation in the right mid lung region. Suspect pneumonia, although underlying mass lesion cannot be excluded. Lungs elsewhere clear. Heart enlarged. Followup PA and lateral chest radiographs recommended in 3-4 weeks following trial of antibiotic therapy to ensure resolution and exclude underlying malignancy. Electronically Signed   By: Bretta Bang III M.D.   On: 11/27/2015 10:03    Scheduled Meds: . amLODipine  10 mg Oral Daily  . aspirin EC  81 mg Oral Daily  . atorvastatin  10 mg Oral Daily  . azithromycin  500 mg Intravenous Q24H  . calcium-vitamin D  1 tablet Oral BID  . cefTRIAXone (ROCEPHIN)  IV  1 g Intravenous Q24H  . enoxaparin (LOVENOX) injection  40 mg Subcutaneous Q24H  . glipiZIDE  10 mg Oral Daily  . hydrochlorothiazide  25 mg Oral Daily  . insulin aspart  0-15 Units Subcutaneous TID WC  . insulin aspart  0-5 Units Subcutaneous QHS  . insulin aspart  4 Units Subcutaneous TID WC  . lisinopril  40 mg Oral Daily  . metoprolol  100 mg Oral BID   Continuous Infusions: . 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 Greater than 50% of this time was spent in direct contact with the patient coordinating care.    Chaya Jan  [facility] Pager [phone]  If 7PM-7AM, please contact night-coverage at [email or web address], password [facility] 11/28/2015, 10:09 AM  LOS: 1 day

## 2015-11-29 DIAGNOSIS — E1121 Type 2 diabetes mellitus with diabetic nephropathy: Secondary | ICD-10-CM

## 2015-11-29 DIAGNOSIS — Z794 Long term (current) use of insulin: Secondary | ICD-10-CM

## 2015-11-29 LAB — CBC
HEMATOCRIT: 37.8 % (ref 36.0–46.0)
HEMOGLOBIN: 12.2 g/dL (ref 12.0–15.0)
MCH: 27.2 pg (ref 26.0–34.0)
MCHC: 32.3 g/dL (ref 30.0–36.0)
MCV: 84.2 fL (ref 78.0–100.0)
Platelets: 359 10*3/uL (ref 150–400)
RBC: 4.49 MIL/uL (ref 3.87–5.11)
RDW: 14.6 % (ref 11.5–15.5)
WBC: 13 10*3/uL — AB (ref 4.0–10.5)

## 2015-11-29 LAB — GLUCOSE, CAPILLARY
Glucose-Capillary: 98 mg/dL (ref 65–99)
Glucose-Capillary: 201 mg/dL — ABNORMAL HIGH (ref 65–99)

## 2015-11-29 LAB — BASIC METABOLIC PANEL
Anion gap: 11 (ref 5–15)
BUN: 38 mg/dL — AB (ref 6–20)
CALCIUM: 8.1 mg/dL — AB (ref 8.9–10.3)
CO2: 30 mmol/L (ref 22–32)
Chloride: 100 mmol/L — ABNORMAL LOW (ref 101–111)
Creatinine, Ser: 0.9 mg/dL (ref 0.44–1.00)
GFR calc Af Amer: >60 mL/min (ref >60)
GLUCOSE: 103 mg/dL — AB (ref 65–99)
POTASSIUM: 3.7 mmol/L (ref 3.5–5.1)
SODIUM: 141 mmol/L (ref 135–145)

## 2015-11-29 LAB — HEMOGLOBIN A1C
HEMOGLOBIN A1C: 7.8 % — AB (ref 4.8–5.6)
Mean Plasma Glucose: 177 mg/dL

## 2015-11-29 LAB — HIV ANTIBODY (ROUTINE TESTING W REFLEX): HIV SCREEN 4TH GENERATION: NONREACTIVE

## 2015-11-29 MED ORDER — AZITHROMYCIN 250 MG PO TABS
500.0000 mg | ORAL_TABLET | Freq: Every day | ORAL | Status: DC
  Administered 2015-11-29: 500 mg via ORAL
  Filled 2015-11-29: qty 2

## 2015-11-29 MED ORDER — AMOXICILLIN-POT CLAVULANATE 875-125 MG PO TABS: 1.0000 | ORAL_TABLET | Freq: Two times a day (BID) | ORAL | Status: DC

## 2015-11-29 NOTE — Progress Notes (Signed)
Pt discharged with son, home with prescriptions, and all belongings.  Iv removed and intact.

## 2015-11-29 NOTE — Care Management Note (Signed)
Case Management Note  Patient Details  Name: TRENIA TENNYSON MRN: [id number] Date of Birth: 01/05/1961  Expected Discharge Date:  11/30/15               Expected Discharge Plan:  Home/Self Care  In-House Referral:  NA  Discharge planning Services     Post Acute Care Choice:  NA Choice offered to:  NA  DME Arranged:    DME Agency:     HH Arranged:    HH Agency:     Status of Service:  Completed, signed off  Medicare Important Message Given:    Date Medicare IM Given:    Medicare IM give by:    Date Additional Medicare IM Given:    Additional Medicare Important Message give by:     If discussed at Long Length of Stay Meetings, dates discussed:    Additional Comments: Pt discharged home today. Pt does not meet requirements for home O2. No needs at DC.   Malcolm Metro, RN 11/29/2015, 3:05 PM

## 2015-11-29 NOTE — Discharge Summary (Signed)
Physician Discharge Summary  Pamela Fry:[id number] DOB: [birth year] DOA: 11/27/2015  PCP: No primary care provider on file.  Admit date: 11/27/2015 Discharge date: 11/29/2015  Time spent: 45 minutes  Recommendations for Outpatient Follow-up:  -We be discharged home today. -Advised to follow-up with primary care provider in 2 weeks. -Recommend repeat chest x-ray in 4-6 weeks to ensure complete resolution of pneumonia.   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 She decided to come to the hospital today because this has not improved. Through the course of her illness she has also had fevers and chills although she has not actually taken her temperature. 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 She has not traveled out of state recently. In the emergency department on chest x-ray she was found to have a consolidated right middle lobe pneumonia. She is found to be hypoxemic with O2 sats of 87% on room air. Labs show a potassium of 2.8, a creatinine of 1.4 which appears to be close to her baseline, a lactic acid that is elevated at 3.08 and a WBC count of 22.9. Admission has been requested for further evaluation and management.  Hospital Course:   Sepsis -Sepsis parameters resolved. -BP stable. -Lactic acid 1.6.  CAP -Feels  improved. -Transitioned to Levaquin for 10 days at discharge. -Strep pneumo/legionella urine Ag negative. -Cx data is still pending at time of discharge.  Acute Hypoxemic Respiratory Failure -2/2 CAP. -Will have an oxygen assessment prior to discharge home and if required we'll arrange for home oxygen.  Morbid Obesity -Noted.  DM II -Uncontrolled. -A1C 7.8. -Close outpatient follow-up.   Hypokalemia -Replaced.  -Mg ok at 1.8.  CKD Stage III -Better than baseline of 1.2-1.4. -Creatinine is 0.90 discharge.  Procedures:  None   Consultations:  None  Discharge Instructions  Discharge Instructions    Diet - low sodium heart healthy    Complete by:  As directed      Increase activity slowly    Complete by:  As directed             Medication List    TAKE these medications        amLODipine 10 MG tablet  Commonly known as:  NORVASC  Take 1 tablet by mouth daily.     amoxicillin-clavulanate 875-125 MG tablet  Commonly known as:  AUGMENTIN  Take 1 tablet by mouth 2 (two) times daily.     aspirin EC 81 MG tablet  Take 81 mg by mouth daily.     atorvastatin 10 MG tablet  Commonly known as:  LIPITOR  Take 1 tablet by mouth daily.     calcium-vitamin D 500-200 MG-UNIT tablet  Commonly known as:  OSCAL WITH D  Take 1 tablet by mouth 2 (two) times daily.  glipiZIDE 10 MG 24 hr tablet  Commonly known as:  GLUCOTROL XL  Take 1 tablet by mouth daily.     hydrochlorothiazide 25 MG tablet  Commonly known as:  HYDRODIURIL  Take 1 tablet by mouth daily.     INVOKANA 300 MG Tabs tablet  Generic drug:  canagliflozin  Take 1 tablet by mouth daily.     lisinopril 40 MG tablet  Commonly known as:  PRINIVIL,ZESTRIL  Take 1 tablet by mouth daily.     metFORMIN 500 MG tablet  Commonly known as:  GLUCOPHAGE  Take 2 tablets by mouth 2 (two) times daily.     metoprolol 100 MG tablet  Commonly known as:  LOPRESSOR  Take 1 tablet by mouth 2 (two) times daily.       VICTOZA 18 MG/3ML Sopn  Generic drug:  Liraglutide  Inject 1.8 mg into the skin daily.       No Known Allergies     Follow-up Information    Schedule an appointment as soon as possible for a visit in 2 weeks to follow up.   Why:  With your regular physician       The results of significant diagnostics from this hospitalization (including imaging, microbiology, ancillary and laboratory) are listed below for reference.    Significant Diagnostic Studies: Dg Chest Port 1 View  11/27/2015  CLINICAL DATA:  Shortness of breath with cough and fever EXAM: PORTABLE CHEST 1 VIEW COMPARISON:  None. FINDINGS: There is extensive airspace consolidation throughout the right mid lung. Left lung clear. Heart is enlarged with pulmonary vascular within normal limits. No adenopathy evident. No bone lesion. IMPRESSION: Large focus of airspace consolidation in the right mid lung region. Suspect pneumonia, although underlying mass lesion cannot be excluded. Lungs elsewhere clear. Heart enlarged. Followup PA and lateral chest radiographs recommended in 3-4 weeks following trial of antibiotic therapy to ensure resolution and exclude underlying malignancy. Electronically Signed   By: Bretta Bang III M.D.   On: 11/27/2015 10:03    Microbiology: Recent Results (from the past 240 hour(s))  Blood Culture (routine x 2)     Status: None (Preliminary result)   Collection Time: 11/27/15 11:11 AM  Result Value Ref Range Status   Specimen Description BLOOD  Final   Special Requests NONE  Final   Culture  Setup Time   Final    GRAM VARIABLE ROD AEROBIC BOTTLE ONLY CRITICAL RESULT CALLED TO, READ BACK BY AND VERIFIED WITH: C PHILLIPS,RN [id number] [facility]    Culture   Final    CULTURE REINCUBATED FOR BETTER GROWTH Performed at [facility]    Report Status PENDING  Incomplete  Blood Culture (routine x 2)     Status: None   Collection Time: 11/27/15 11:14 AM  Result Value Ref Range Status    Specimen Description BLOOD  Final   Special Requests NONE  Final   Culture  Setup Time   Final    GRAM VARIABLE ROD ANAEROBIC BOTTLE ONLY CRITICAL RESULT CALLED TO, READ BACK BY AND VERIFIED WITH: C PHILLIPS,RN [id number] [address]    Culture   Final    CULTURE REINCUBATED FOR BETTER GROWTH Performed at [facility]    Report Status 11/28/2015 FINAL  Final  MRSA PCR Screening     Status: None   Collection Time: 11/27/15  3:50 PM  Result Value Ref Range Status   MRSA by PCR NEGATIVE NEGATIVE Final    Comment:  The GeneXpert MRSA Assay (FDA approved for NASAL specimens only), is one component of a comprehensive MRSA colonization surveillance program. It is not intended to diagnose MRSA infection nor to guide or monitor treatment for MRSA infections.      Labs: Basic Metabolic Panel:  Recent Labs Lab 11/27/15 0925 11/27/15 1706 11/28/15 0504 11/29/15 0517  NA 134* 136 138 141  K 2.8* 2.9* 3.4* 3.7  CL 86* 91* 100* 100*  CO2 34* GLUCOSE 295* 344* 232* 103*  BUN 30* 28* 34* 38*  CREATININE 1.42* 1.31* 1.00 0.90  CALCIUM 7.4* 7.0* 7.2* 8.1*  MG  --  1.8  --   --    Liver Function Tests:  Recent Labs Lab 11/27/15 0925 11/27/15 1706 11/28/15 0504  AST ALT 16 17 13*  ALKPHOS 100 93 83  BILITOT 0.6 0.8 0.7  PROT 7.1 6.8 6.9  ALBUMIN 2.9* 2.6* 2.5*   No results for input(s): LIPASE, AMYLASE in the last 168 hours. No results for input(s): AMMONIA in the last 168 hours. CBC:  Recent Labs Lab 11/27/15 0925 11/27/15 1706 11/29/15 0517  WBC 22.9* 18.5* 13.0*  NEUTROABS  --  17.6*  --   HGB 10.9* 11.5* 12.2  HCT 33.1* 34.4* 37.8  MCV 81.3 82.1 84.2  PLT 207 222 359   Cardiac Enzymes: No results for input(s): CKTOTAL, CKMB, CKMBINDEX, TROPONINI in the last 168 hours. BNP: BNP (last 3 results)  Recent Labs  11/27/15 0925  BNP 270.0*    ProBNP (last 3 results) No results for input(s): PROBNP in the last 8760  hours.  CBG:  Recent Labs Lab 11/28/15 1136 11/28/15 1749 11/28/15 2032 11/29/15 0735 11/29/15 1121  GLUCAP 255* 382* 356* 98 201*       Signed:  HERNANDEZ ACOSTA,ESTELA  [facility] Pager: [phone] 11/29/2015, 11:59 AM

## 2015-11-29 NOTE — Progress Notes (Signed)
PHARMACIST - PHYSICIAN COMMUNICATION DR:   [facility] CONCERNING: Antibiotic IV to Oral Route Change Policy  RECOMMENDATION: This patient is receiving Azithromycin by the intravenous route.  Based on criteria approved by the Pharmacy and Therapeutics Committee, the antibiotic(s) is/are being converted to the equivalent oral dose form(s).   DESCRIPTION: These criteria include:  Patient being treated for a respiratory tract infection, urinary tract infection, cellulitis or clostridium difficile associated diarrhea if on metronidazole  The patient is not neutropenic and does not exhibit a GI malabsorption state  The patient is eating (either orally or via tube) and/or has been taking other orally administered medications for a least 24 hours  The patient is improving clinically and has a Tmax < 100.5  If you have questions about this conversion, please contact the Pharmacy Department    [phone] )  [facility]   [phone] )  [facility]   [phone] )  [facility]   [phone] )  [facility]   [phone] )  [facility]   Mady Gemma, [facility] 11/29/2015 9:47 AM

## 2015-11-29 NOTE — Progress Notes (Signed)
SATURATION QUALIFICATIONS: (This note is used to comply with regulatory documentation for home oxygen)  Patient Saturations on Room Air at Rest = 94%  Patient Saturations on Room Air while Ambulating = 89%  No O2 needed.

## 2015-11-30 LAB — CULTURE, BLOOD (ROUTINE X 2)

## 2015-11-30 NOTE — Progress Notes (Addendum)
Called by the lab about the positive BC, Serratia M and Pneumococcus. Discussed with ID, Dr Ninetta Lights, who recommended oral Levoquin for 2 weeks. I have contacted patient, and left my cell number for her to call me back, but hadn't heard from her at this time.   Dec 01, 2015 at 09.16. Spoke with patient, called Rx to [facility] at [phone].  Levaquin  per day for 14 days. She hadn't made her appointment for follow up, and was encouraged to do so.

## 2016-11-09 ENCOUNTER — Ambulatory Visit (INDEPENDENT_AMBULATORY_CARE_PROVIDER_SITE_OTHER): Payer: Worker's Compensation

## 2016-11-09 ENCOUNTER — Ambulatory Visit (INDEPENDENT_AMBULATORY_CARE_PROVIDER_SITE_OTHER): Payer: Worker's Compensation | Admitting: Pediatrics

## 2016-11-09 ENCOUNTER — Encounter: Payer: Self-pay | Admitting: Pediatrics

## 2016-11-09 VITALS — BP 131/71 | HR 91 | Temp 96.8°F | Ht 60.0 in | Wt 208.2 lb

## 2016-11-09 DIAGNOSIS — M25571 Pain in right ankle and joints of right foot: Secondary | ICD-10-CM

## 2016-11-09 DIAGNOSIS — L089 Local infection of the skin and subcutaneous tissue, unspecified: Secondary | ICD-10-CM | POA: Diagnosis not present

## 2016-11-09 DIAGNOSIS — E11628 Type 2 diabetes mellitus with other skin complications: Secondary | ICD-10-CM | POA: Diagnosis not present

## 2016-11-09 MED ORDER — CIPROFLOXACIN HCL 500 MG PO TABS: 500.0000 mg | ORAL_TABLET | Freq: Two times a day (BID) | ORAL | 0 refills | Status: DC

## 2016-11-09 MED ORDER — CLINDAMYCIN HCL 300 MG PO CAPS: 300.0000 mg | ORAL_CAPSULE | Freq: Four times a day (QID) | ORAL | 0 refills | Status: DC

## 2016-11-09 NOTE — Patient Instructions (Signed)
Call for appointment to see your primary care doctor next week. Follow up for workers comp in 1 week. If any worsening of symptoms, any fevers, need to go to emergency room.

## 2016-11-09 NOTE — Progress Notes (Signed)
  Subjective:   Patient ID: Pamela Fry, female    DOB: 05/16/1961, 56 y.o.   MRN: [id number] CC: Workers Occupational hygienistComp; Foot Pain; and Ankle Pain  HPI: Pamela Fry is a 56 y.o. female presenting for Workers Comp; Foot Pain; and Ankle Pain  Dropped a package of yarn on the top of her R foot, was at least 10 lbs 4 days ago Able to walk on foot after incident, minimal pain that night The next day started hurting some, hurting even more and turned red the next day Now hurts to walk on it, swollen No fevers ROS: Per HPI   Objective:    BP 131/71   Pulse 91   Temp (!) 96.8 F (36 C) (Oral)   Ht 5' (1.524 m)   Wt 208 lb 3.2 oz (94.4 kg)   BMI 40.66 kg/m   Wt Readings from Last 3 Encounters:  11/09/16 208 lb 3.2 oz (94.4 kg)  11/28/15 221 lb 9 oz (100.5 kg)    Gen: NAD, alert, cooperative with exam, NCAT EYES: EOMI, no conjunctival injection, or no icterus CV: NRRR, normal S1/S2, no murmur, distal pulses 2+ b/l Resp: CTABL, no wheezes, normal WOB Neuro: Alert and oriented MSK: can wiggle her toes R foot, pain with dorsiflexion, plantar flexion at the ankle Skin: R prox dorsum of foot and anterior ankle and lower leg red, hot, swollen. Some skin peeling from top of R foot in two separate areas, apprx 2 cm across  Assessment & Plan:  Pamela Fry was seen today for workers comp, foot pain and ankle pain.  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 -      clindamycin (CLEOCIN) 300 MG capsule; Take 1 capsule (300 mg total) by mouth 4 (four) times daily.   Follow up plan: Return in about 1 week (around 11/16/2016). Rex Krasarol Vincent, MD [facility]

## 2016-11-12 ENCOUNTER — Encounter (HOSPITAL_COMMUNITY): Payer: Self-pay | Admitting: Emergency Medicine

## 2016-11-12 ENCOUNTER — Emergency Department (HOSPITAL_COMMUNITY): Payer: Worker's Compensation

## 2016-11-12 ENCOUNTER — Inpatient Hospital Stay (HOSPITAL_COMMUNITY)
Admission: EM | Admit: 2016-11-12 | Discharge: 2016-11-19 | DRG: 603 | Disposition: A | Discharge status: Home Health | Payer: Worker's Compensation | Attending: Family Medicine | Admitting: Family Medicine

## 2016-11-12 DIAGNOSIS — E1165 Type 2 diabetes mellitus with hyperglycemia: Secondary | ICD-10-CM | POA: Diagnosis present

## 2016-11-12 DIAGNOSIS — M7989 Other specified soft tissue disorders: Secondary | ICD-10-CM | POA: Diagnosis present

## 2016-11-12 DIAGNOSIS — L03119 Cellulitis of unspecified part of limb: Secondary | ICD-10-CM | POA: Diagnosis not present

## 2016-11-12 DIAGNOSIS — L03031 Cellulitis of right toe: Secondary | ICD-10-CM | POA: Diagnosis not present

## 2016-11-12 DIAGNOSIS — Z794 Long term (current) use of insulin: Secondary | ICD-10-CM

## 2016-11-12 DIAGNOSIS — Z7984 Long term (current) use of oral hypoglycemic drugs: Secondary | ICD-10-CM | POA: Diagnosis not present

## 2016-11-12 DIAGNOSIS — R739 Hyperglycemia, unspecified: Secondary | ICD-10-CM

## 2016-11-12 DIAGNOSIS — Z6841 Body Mass Index (BMI) 40.0 and over, adult: Secondary | ICD-10-CM | POA: Diagnosis not present

## 2016-11-12 DIAGNOSIS — E1129 Type 2 diabetes mellitus with other diabetic kidney complication: Secondary | ICD-10-CM | POA: Diagnosis present

## 2016-11-12 DIAGNOSIS — Z7982 Long term (current) use of aspirin: Secondary | ICD-10-CM | POA: Diagnosis not present

## 2016-11-12 DIAGNOSIS — L03115 Cellulitis of right lower limb: Principal | ICD-10-CM | POA: Diagnosis present

## 2016-11-12 DIAGNOSIS — Z9049 Acquired absence of other specified parts of digestive tract: Secondary | ICD-10-CM

## 2016-11-12 DIAGNOSIS — E1121 Type 2 diabetes mellitus with diabetic nephropathy: Secondary | ICD-10-CM | POA: Diagnosis not present

## 2016-11-12 DIAGNOSIS — E785 Hyperlipidemia, unspecified: Secondary | ICD-10-CM | POA: Diagnosis present

## 2016-11-12 DIAGNOSIS — N181 Chronic kidney disease, stage 1: Secondary | ICD-10-CM | POA: Diagnosis present

## 2016-11-12 DIAGNOSIS — R2689 Other abnormalities of gait and mobility: Secondary | ICD-10-CM

## 2016-11-12 DIAGNOSIS — L0291 Cutaneous abscess, unspecified: Secondary | ICD-10-CM

## 2016-11-12 DIAGNOSIS — I10 Essential (primary) hypertension: Secondary | ICD-10-CM | POA: Diagnosis not present

## 2016-11-12 DIAGNOSIS — I129 Hypertensive chronic kidney disease with stage 1 through stage 4 chronic kidney disease, or unspecified chronic kidney disease: Secondary | ICD-10-CM | POA: Diagnosis present

## 2016-11-12 DIAGNOSIS — Z79899 Other long term (current) drug therapy: Secondary | ICD-10-CM | POA: Diagnosis not present

## 2016-11-12 DIAGNOSIS — N179 Acute kidney failure, unspecified: Secondary | ICD-10-CM | POA: Diagnosis present

## 2016-11-12 DIAGNOSIS — E1122 Type 2 diabetes mellitus with diabetic chronic kidney disease: Secondary | ICD-10-CM | POA: Diagnosis present

## 2016-11-12 DIAGNOSIS — L0390 Cellulitis, unspecified: Secondary | ICD-10-CM | POA: Diagnosis present

## 2016-11-12 LAB — COMPREHENSIVE METABOLIC PANEL
ALBUMIN: 3.3 g/dL — AB (ref 3.5–5.0)
ALT: 17 U/L (ref 14–54)
ANION GAP: 15 (ref 5–15)
AST: 14 U/L — AB (ref 15–41)
Alkaline Phosphatase: 87 U/L (ref 38–126)
BILIRUBIN TOTAL: 1 mg/dL (ref 0.3–1.2)
BUN: 29 mg/dL — AB (ref 6–20)
CHLORIDE: 91 mmol/L — AB (ref 101–111)
CO2: 23 mmol/L (ref 22–32)
Calcium: 8.9 mg/dL (ref 8.9–10.3)
Creatinine, Ser: 1.17 mg/dL — ABNORMAL HIGH (ref 0.44–1.00)
GFR calc Af Amer: 59 mL/min — ABNORMAL LOW (ref >60)
GFR, EST NON AFRICAN AMERICAN: 51 mL/min — AB (ref >60)
Glucose, Bld: 308 mg/dL — ABNORMAL HIGH (ref 65–99)
POTASSIUM: 3.8 mmol/L (ref 3.5–5.1)
Sodium: 129 mmol/L — ABNORMAL LOW (ref 135–145)
TOTAL PROTEIN: 9.8 g/dL — AB (ref 6.5–8.1)

## 2016-11-12 LAB — CBC WITH DIFFERENTIAL/PLATELET
BASOS ABS: 0.1 10*3/uL (ref 0.0–0.1)
BASOS PCT: 0 %
EOS PCT: 1 %
Eosinophils Absolute: 0.1 10*3/uL (ref 0.0–0.7)
HCT: 35.5 % — ABNORMAL LOW (ref 36.0–46.0)
Hemoglobin: 11.9 g/dL — ABNORMAL LOW (ref 12.0–15.0)
Lymphocytes Relative: 14 %
Lymphs Abs: 2 10*3/uL (ref 0.7–4.0)
MCH: 26.6 pg (ref 26.0–34.0)
MCHC: 33.5 g/dL (ref 30.0–36.0)
MCV: 79.4 fL (ref 78.0–100.0)
MONOS PCT: 7 %
Monocytes Absolute: 1 10*3/uL (ref 0.1–1.0)
Neutro Abs: 11.2 10*3/uL — ABNORMAL HIGH (ref 1.7–7.7)
Neutrophils Relative %: 78 %
PLATELETS: 482 10*3/uL — AB (ref 150–400)
RBC: 4.47 MIL/uL (ref 3.87–5.11)
RDW: 15.1 % (ref 11.5–15.5)
WBC: 14.3 10*3/uL — ABNORMAL HIGH (ref 4.0–10.5)

## 2016-11-12 LAB — I-STAT CG4 LACTIC ACID, ED: LACTIC ACID, VENOUS: 2.16 mmol/L — AB (ref 0.5–1.9)

## 2016-11-12 LAB — CBG MONITORING, ED: Glucose-Capillary: 264 mg/dL — ABNORMAL HIGH (ref 65–99)

## 2016-11-12 MED ORDER — SODIUM CHLORIDE 0.9 % IV BOLUS (SEPSIS)
1000.0000 mL | Freq: Once | INTRAVENOUS | Status: AC
  Administered 2016-11-12: 1000 mL via INTRAVENOUS

## 2016-11-12 MED ORDER — METOPROLOL TARTRATE 50 MG PO TABS
100.0000 mg | ORAL_TABLET | Freq: Two times a day (BID) | ORAL | Status: DC
  Administered 2016-11-13 – 2016-11-19 (×14): 100 mg via ORAL
  Filled 2016-11-12 (×13): qty 2

## 2016-11-12 MED ORDER — MORPHINE SULFATE (PF) 4 MG/ML IV SOLN
4.0000 mg | Freq: Once | INTRAVENOUS | Status: AC
  Administered 2016-11-12: 4 mg via INTRAVENOUS
  Filled 2016-11-12: qty 1

## 2016-11-12 MED ORDER — VANCOMYCIN HCL IN DEXTROSE 1-5 GM/200ML-% IV SOLN
1000.0000 mg | Freq: Once | INTRAVENOUS | Status: AC
  Administered 2016-11-12: 1000 mg via INTRAVENOUS
  Filled 2016-11-12: qty 200

## 2016-11-12 MED ORDER — SODIUM CHLORIDE 0.9 % IV SOLN: INTRAVENOUS | Status: DC

## 2016-11-12 MED ORDER — ONDANSETRON HCL 4 MG PO TABS
4.0000 mg | ORAL_TABLET | Freq: Four times a day (QID) | ORAL | Status: DC | PRN
  Filled 2016-11-12 (×3): qty 1

## 2016-11-12 MED ORDER — ATORVASTATIN CALCIUM 10 MG PO TABS
10.0000 mg | ORAL_TABLET | Freq: Every day | ORAL | Status: DC
  Administered 2016-11-13 – 2016-11-19 (×7): 10 mg via ORAL
  Filled 2016-11-12 (×8): qty 1

## 2016-11-12 MED ORDER — ASPIRIN EC 81 MG PO TBEC
81.0000 mg | DELAYED_RELEASE_TABLET | Freq: Every day | ORAL | Status: DC
  Administered 2016-11-13 – 2016-11-19 (×7): 81 mg via ORAL
  Filled 2016-11-12 (×7): qty 1

## 2016-11-12 MED ORDER — INSULIN ASPART 100 UNIT/ML ~~LOC~~ SOLN
0.0000 [IU] | Freq: Every day | SUBCUTANEOUS | Status: DC
  Administered 2016-11-13: 5 [IU] via SUBCUTANEOUS
  Administered 2016-11-13: 3 [IU] via SUBCUTANEOUS
  Administered 2016-11-14: 5 [IU] via SUBCUTANEOUS
  Administered 2016-11-15: 2 [IU] via SUBCUTANEOUS
  Administered 2016-11-16: 3 [IU] via SUBCUTANEOUS
  Administered 2016-11-17 – 2016-11-18 (×2): 2 [IU] via SUBCUTANEOUS
  Filled 2016-11-12: qty 1

## 2016-11-12 MED ORDER — ONDANSETRON HCL 4 MG/2ML IJ SOLN
4.0000 mg | Freq: Four times a day (QID) | INTRAMUSCULAR | Status: DC | PRN
  Filled 2016-11-12: qty 2

## 2016-11-12 MED ORDER — AMLODIPINE BESYLATE 5 MG PO TABS
10.0000 mg | ORAL_TABLET | Freq: Every day | ORAL | Status: DC
  Administered 2016-11-13 – 2016-11-19 (×7): 10 mg via ORAL
  Filled 2016-11-12 (×7): qty 2

## 2016-11-12 MED ORDER — SODIUM CHLORIDE 0.9 % IV SOLN
INTRAVENOUS | Status: AC
  Administered 2016-11-13: 08:00:00 via INTRAVENOUS

## 2016-11-12 MED ORDER — INSULIN ASPART 100 UNIT/ML ~~LOC~~ SOLN
0.0000 [IU] | Freq: Three times a day (TID) | SUBCUTANEOUS | Status: DC
  Administered 2016-11-13: 2 [IU] via SUBCUTANEOUS
  Administered 2016-11-13: 7 [IU] via SUBCUTANEOUS
  Administered 2016-11-13 – 2016-11-14 (×2): 3 [IU] via SUBCUTANEOUS
  Administered 2016-11-14: 2 [IU] via SUBCUTANEOUS
  Administered 2016-11-14: 1 [IU] via SUBCUTANEOUS
  Administered 2016-11-15: 2 [IU] via SUBCUTANEOUS
  Administered 2016-11-15: 3 [IU] via SUBCUTANEOUS
  Administered 2016-11-15: 5 [IU] via SUBCUTANEOUS
  Administered 2016-11-16: 1 [IU] via SUBCUTANEOUS
  Administered 2016-11-16: 2 [IU] via SUBCUTANEOUS
  Administered 2016-11-17: 5 [IU] via SUBCUTANEOUS
  Administered 2016-11-17: 3 [IU] via SUBCUTANEOUS
  Administered 2016-11-18: 2 [IU] via SUBCUTANEOUS
  Administered 2016-11-18 – 2016-11-19 (×3): 1 [IU] via SUBCUTANEOUS

## 2016-11-12 MED ORDER — ONDANSETRON HCL 4 MG/2ML IJ SOLN
4.0000 mg | Freq: Once | INTRAMUSCULAR | Status: AC
  Administered 2016-11-12: 4 mg via INTRAVENOUS
  Filled 2016-11-12: qty 2

## 2016-11-12 MED ORDER — OXYCODONE HCL 5 MG PO TABS
5.0000 mg | ORAL_TABLET | ORAL | Status: DC | PRN
  Administered 2016-11-13 – 2016-11-18 (×16): 5 mg via ORAL
  Filled 2016-11-12 (×19): qty 1

## 2016-11-12 MED ORDER — ENOXAPARIN SODIUM 40 MG/0.4ML ~~LOC~~ SOLN
40.0000 mg | SUBCUTANEOUS | Status: DC
  Administered 2016-11-13 – 2016-11-18 (×7): 40 mg via SUBCUTANEOUS
  Filled 2016-11-12 (×7): qty 0.4

## 2016-11-12 NOTE — H&P (Signed)
History and Physical    Pamela Fry UXL:[id number] DOB: [birth year] DOA: 11/12/2016  PCP: Pcp Not In System  Patient coming from:  home  Chief Complaint:  Right foot red  HPI: Pamela Fry is a 56 y.o. female with medical history significant of DM, HTN comes in with 2 days of progressive worsening swelling and redness to her right forefoot.  She was started on abx yesterday by outpatient doctor but it has gotten worse.  Has not been on abx prior to yesterday.  No recent injuries to the foot.  Since it started swelling and getting red it has developed a superficial wound and crack that was not there before 2 days ago.  She is referred for treatment of cellulitis.  Review of Systems: As per HPI otherwise 10 point review of systems negative.   Past Medical History:  Diagnosis Date  . Diabetes mellitus without complication (HCC)   . High cholesterol   . Hypertension     Past Surgical History:  Procedure Laterality Date  . CESAREAN SECTION    . CHOLECYSTECTOMY       reports that she has never smoked. She has never used smokeless tobacco. She reports that she does not drink alcohol or use drugs.  No Known Allergies  Family History  Problem Relation Age of Onset  . Diabetes Mother     Prior to Admission medications   Medication Sig Start Date End Date Taking? Authorizing Provider  amLODipine (NORVASC) 10 MG tablet Take 1 tablet by mouth daily. 11/16/15  Yes Historical Provider, MD  aspirin EC 81 MG tablet Take 81 mg by mouth daily.   Yes Historical Provider, MD  atorvastatin (LIPITOR) 10 MG tablet Take 1 tablet by mouth daily. 11/18/15  Yes Historical Provider, MD  calcium-vitamin D (OSCAL WITH D) 500-200 MG-UNIT tablet Take 1 tablet by mouth 2 (two) times daily.   Yes Historical Provider, MD  Cinnamon 500 MG capsule Take 1,000 mg by mouth daily.   Yes Historical Provider, MD  ciprofloxacin (CIPRO) 500 MG tablet Take 1 tablet (500 mg total) by mouth 2 (two) times daily. 11/09/16  Yes  Johna Sheriff, MD  clindamycin (CLEOCIN) 300 MG capsule Take 1 capsule (300 mg total) by mouth 4 (four) times daily. 11/09/16  Yes Johna Sheriff, MD  glipiZIDE (GLUCOTROL XL) 10 MG 24 hr tablet Take 1 tablet by mouth daily. 11/16/15  Yes Historical Provider, MD  hydrochlorothiazide (HYDRODIURIL) 25 MG tablet Take 1 tablet by mouth daily. 11/16/15  Yes Historical Provider, MD  INVOKAMET XR [phone] MG TB24 Take 1 tablet by mouth 2 (two) times daily.  10/17/16  Yes Historical Provider, MD  lisinopril (PRINIVIL,ZESTRIL) 40 MG tablet Take 1 tablet by mouth daily. 11/16/15  Yes Historical Provider, MD  metoprolol (LOPRESSOR) 100 MG tablet Take 1 tablet by mouth 2 (two) times daily. 11/16/15  Yes Historical Provider, MD  VICTOZA 18 MG/3ML SOPN Inject 1.8 mg into the skin daily. 11/16/15  Yes Historical Provider, MD    Physical Exam: Vitals:   11/12/16 1518  BP: 147/76  Pulse: 113  Resp: 19  Temp: 97.5 F (36.4 C)  TempSrc: Oral  SpO2: 99%  Weight: 94.3 kg (208 lb)  Height: 5' (1.524 m)    Constitutional: NAD, calm, comfortable Vitals:   11/12/16 1518  BP: 147/76  Pulse: 113  Resp: 19  Temp: 97.5 F (36.4 C)  TempSrc: Oral  SpO2: 99%  Weight: 94.3 kg (208 lb)  Height: 5' (1.524  m)   Eyes: PERRL, lids and conjunctivae normal ENMT: Mucous membranes are moist. Posterior pharynx clear of any exudate or lesions.Normal dentition.  Neck: normal, supple, no masses, no thyromegaly Respiratory: clear to auscultation bilaterally, no wheezing, no crackles. Normal respiratory effort. No accessory muscle use.  Cardiovascular: Regular rate and rhythm, no murmurs / rubs / gallops. No extremity edema. 2+ pedal pulses. No carotid bruits.  Abdomen: no tenderness, no masses palpated. No hepatosplenomegaly. Bowel sounds positive.  Musculoskeletal: no clubbing / cyanosis. No joint deformity upper and lower extremities. Good ROM, no contractures. Normal muscle tone.  Skin:  Red rash to left foot and ankle c/w  cellulitis no absess felt, lesions, ulcers. No induration Neurologic: CN 2-12 grossly intact. Sensation intact, DTR normal. Strength 5/5 in all 4.  Psychiatric: Normal judgment and insight. Alert and oriented x 3. Normal mood.    Labs on Admission: I have personally reviewed following labs and imaging studies  CBC:  Recent Labs Lab 11/12/16 1742  WBC 14.3*  NEUTROABS 11.2*  HGB 11.9*  HCT 35.5*  MCV 79.4  PLT 482*   Basic Metabolic Panel:  Recent Labs Lab 11/12/16 1742  NA 129*  K 3.8  CL 91*  CO2 23  GLUCOSE 308*  BUN 29*  CREATININE 1.17*  CALCIUM 8.9   GFR: Estimated Creatinine Clearance: 54.4 mL/min (by C-G formula based on SCr of 1.17 mg/dL (H)). Liver Function Tests:  Recent Labs Lab 11/12/16 1742  AST 14*  ALT 17  ALKPHOS 87  BILITOT 1.0  PROT 9.8*  ALBUMIN 3.3*     Recent Results (from the past 240 hour(s))  Blood culture (routine x 2)     Status: None (Preliminary result)   Collection Time: 11/12/16  7:42 PM  Result Value Ref Range Status   Specimen Description LEFT ANTECUBITAL  Final   Special Requests BOTTLES DRAWN AEROBIC AND ANAEROBIC [facility]6CC EACH  Final   Culture PENDING  Incomplete   Report Status PENDING  Incomplete  Blood culture (routine x 2)     Status: None (Preliminary result)   Collection Time: 11/12/16  8:23 PM  Result Value Ref Range Status   Specimen Description BLOOD RIGHT FOREARM  Final   Special Requests BOTTLES DRAWN AEROBIC AND ANAEROBIC 6CC  Final   Culture PENDING  Incomplete   Report Status PENDING  Incomplete     Radiological Exams on Admission: Dg Foot Complete Right  Result Date: 11/12/2016 CLINICAL DATA:  Wound to top of foot, redness and swelling to the level of the mid tibia/fibula. Injury to right ankle and lower leg on 11/05/2016 with subsequent redness/drainage and increased swelling. EXAM: RIGHT FOOT COMPLETE - 3+ VIEW COMPARISON:  Plain film of the right foot and ankle dated 11/09/2016 FINDINGS: Osseous  alignment remains normal. Overall bone mineralization is normal. No fracture line or displaced fracture fragment identified. No acute or suspicious osseous lesion. No destructive change seen to suggest osteomyelitis. There is at least mild soft tissue thickening/edema over the dorsum of the midfoot. No soft tissue gas or air-fluid levels are identified. IMPRESSION: 1. Soft tissue swelling. 2. No acute or suspicious osseous finding. Electronically Signed   By: Bary RichardStan  Maynard M.D.   On: 11/12/2016 20:27   Old chart reviewed Case discussed with edp  Assessment/Plan 56 yo female diabetic with cellulitis to right foot  Principal Problem:   Cellulitis right foot - iv vancomycin.  Elevated limb.  Pt will likely be here several days due to limb threatening infection.  Hopefully will improve with aggressive iv antibiotics.  Pt not toxic at this time.  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

## 2016-11-12 NOTE — ED Provider Notes (Signed)
[facility]-EMERGENCY DEPT Provider Note   CSN: [id number] Arrival date & time: 11/12/16  1504     History   Chief Complaint Chief Complaint  Patient presents with  . Wound Infection    HPI Pamela Fry is a 56 y.o. female.  Pt said she dropped a box on her right leg on 1/22.  She noticed some redness and swelling the next day.  She saw her pcp on 1/26 and was started on clinda and cipro.  The pt said she's been taking them, but sx are worse.  Pt has not had any fevers, but said her foot is more red and swollen.  She is a diabetic, but said her blood sugars have been ok.      Past Medical History:  Diagnosis Date  . Diabetes mellitus without complication (HCC)   . High cholesterol   . Hypertension     Patient Active Problem List   Diagnosis Date Noted  . Cellulitis 11/12/2016  . Sepsis (HCC) 11/27/2015  . CAP (community acquired pneumonia) 11/27/2015  . Acute hypoxemic respiratory failure (HCC) 11/27/2015  . Morbid obesity (HCC) 11/27/2015  . HTN (hypertension) 11/27/2015  . 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 Hyperlipidemia 11/27/2015  . Hypokalemia 11/27/2015    Past Surgical History:  Procedure Laterality Date  . CESAREAN SECTION    . CHOLECYSTECTOMY      OB History    Gravida Para Term Preterm AB Living   1 1 1     1    SAB TAB Ectopic Multiple Live Births                   Home Medications    Prior to Admission medications   Medication Sig Start Date End Date Taking? Authorizing Provider  amLODipine (NORVASC) 10 MG tablet Take 1 tablet by mouth daily. 11/16/15  Yes Historical Provider, MD  aspirin EC 81 MG tablet Take 81 mg by mouth daily.   Yes Historical Provider, MD  atorvastatin (LIPITOR) 10 MG tablet Take 1 tablet by mouth daily. 11/18/15  Yes Historical Provider, MD  calcium-vitamin D (OSCAL WITH D) 500-200 MG-UNIT tablet Take 1 tablet by mouth 2 (two) times daily.   Yes Historical Provider, MD  Cinnamon 500 MG capsule Take 1,000 mg by  mouth daily.   Yes Historical Provider, MD  ciprofloxacin (CIPRO) 500 MG tablet Take 1 tablet (500 mg total) by mouth 2 (two) times daily. 11/09/16  Yes Johna Sheriffarol L Vincent, MD  clindamycin (CLEOCIN) 300 MG capsule Take 1 capsule (300 mg total) by mouth 4 (four) times daily. 11/09/16  Yes Johna Sheriffarol L Vincent, MD  glipiZIDE (GLUCOTROL XL) 10 MG 24 hr tablet Take 1 tablet by mouth daily. 11/16/15  Yes Historical Provider, MD  hydrochlorothiazide (HYDRODIURIL) 25 MG tablet Take 1 tablet by mouth daily. 11/16/15  Yes Historical Provider, MD  INVOKAMET XR [phone] MG TB24 Take 1 tablet by mouth 2 (two) times daily.  10/17/16  Yes Historical Provider, MD  lisinopril (PRINIVIL,ZESTRIL) 40 MG tablet Take 1 tablet by mouth daily. 11/16/15  Yes Historical Provider, MD  metoprolol (LOPRESSOR) 100 MG tablet Take 1 tablet by mouth 2 (two) times daily. 11/16/15  Yes Historical Provider, MD  VICTOZA 18 MG/3ML SOPN Inject 1.8 mg into the skin daily. 11/16/15  Yes Historical Provider, MD    Family History Family History  Problem Relation Age of Onset  . Diabetes Mother     Social History Social History  Substance Use  Topics  . Smoking status: Never Smoker  . Smokeless tobacco: Never Used  . Alcohol use No     Allergies   Patient has no known allergies.   Review of Systems Review of Systems  Skin: Positive for wound.  All other systems reviewed and are negative.    Physical Exam Updated Vital Signs BP 147/76 (BP Location: Left Arm)   Pulse 113   Temp 97.5 F (36.4 C) (Oral)   Resp 19   Ht 5' (1.524 m)   Wt 208 lb (94.3 kg)   SpO2 99%   BMI 40.62 kg/m   Physical Exam  Constitutional: She is oriented to person, place, and time. She appears well-developed and well-nourished.  HENT:  Head: Normocephalic and atraumatic.  Right Ear: External ear normal.  Left Ear: External ear normal.  Nose: Nose normal.  Mouth/Throat: Oropharynx is clear and moist.  Eyes: Conjunctivae and EOM are normal. Pupils are  equal, round, and reactive to light.  Neck: Normal range of motion. Neck supple.  Cardiovascular: Regular rhythm, normal heart sounds and intact distal pulses.  Tachycardia present.   Pulmonary/Chest: Effort normal and breath sounds normal.  Abdominal: Soft. Bowel sounds are normal.  Musculoskeletal: Normal range of motion.  Neurological: She is alert and oriented to person, place, and time.  Skin: Skin is warm.     Psychiatric: She has a normal mood and affect. Her behavior is normal. Judgment and thought content normal.  Nursing note and vitals reviewed.    ED Treatments / Results  Labs (all labs ordered are listed, but only abnormal results are displayed) Labs Reviewed  CBC WITH DIFFERENTIAL/PLATELET - Abnormal; Notable for the following:       Result Value   WBC 14.3 (*)    Hemoglobin 11.9 (*)    HCT 35.5 (*)    Platelets 482 (*)    Neutro Abs 11.2 (*)    All other components within normal limits  COMPREHENSIVE METABOLIC PANEL - Abnormal; Notable for the following:    Sodium 129 (*)    Chloride 91 (*)    Glucose, Bld 308 (*)    BUN 29 (*)    Creatinine, Ser 1.17 (*)    Total Protein 9.8 (*)    Albumin 3.3 (*)    AST 14 (*)    GFR calc non Af Amer 51 (*)    GFR calc Af Amer 59 (*)    All other components within normal limits  I-STAT CG4 LACTIC ACID, ED - Abnormal; Notable for the following:    Lactic Acid, Venous 2.16 (*)    All other components within normal limits  CULTURE, BLOOD (ROUTINE X 2)  CULTURE, BLOOD (ROUTINE X 2)  CBG MONITORING, ED    EKG  EKG Interpretation None       Radiology Dg Foot Complete Right  Result Date: 11/12/2016 CLINICAL DATA:  Wound to top of foot, redness and swelling to the level of the mid tibia/fibula. Injury to right ankle and lower leg on 11/05/2016 with subsequent redness/drainage and increased swelling. EXAM: RIGHT FOOT COMPLETE - 3+ VIEW COMPARISON:  Plain film of the right foot and ankle dated 11/09/2016 FINDINGS:  Osseous alignment remains normal. Overall bone mineralization is normal. No fracture line or displaced fracture fragment identified. No acute or suspicious osseous lesion. No destructive change seen to suggest osteomyelitis. There is at least mild soft tissue thickening/edema over the dorsum of the midfoot. No soft tissue gas or air-fluid levels are identified. IMPRESSION:  1. Soft tissue swelling. 2. No acute or suspicious osseous finding. Electronically Signed   By: Bary Richard M.D.   On: 11/12/2016 20:27    Procedures Procedures (including critical care time)  Medications Ordered in ED Medications  vancomycin (VANCOCIN) IVPB 1000 mg/200 mL premix (1,000 mg Intravenous New Bag/Given 11/12/16 2040)  morphine 4 MG/ML injection 4 mg (4 mg Intravenous Given 11/12/16 2039)  ondansetron (ZOFRAN) injection 4 mg (4 mg Intravenous Given 11/12/16 2037)  sodium chloride 0.9 % bolus 1,000 mL (1,000 mLs Intravenous New Bag/Given 11/12/16 2045)     Initial Impression / Assessment and Plan / ED Course  I have reviewed the triage vital signs and the nursing notes.  Pertinent labs & imaging results that were available during my care of the patient were reviewed by me and considered in my medical decision making (see chart for details).  Pt's cellulitis is worsening despite abx treatment.  Pt d/w Dr. Onalee Hua ([facility]) for admission.  Final Clinical Impressions(s) / ED Diagnoses   Final diagnoses:  Cellulitis of right lower extremity  Hyperglycemia    New Prescriptions New Prescriptions   No medications on file     Jacalyn Lefevre, MD 11/12/16 2121

## 2016-11-12 NOTE — ED Triage Notes (Signed)
PT states she dropped a box on her right lower leg/ankle on 11/05/16 and since then it started having redness/driagage and increased swelling. PT stated her PCP started her on cipro and clindamycin on 11/09/16 and since then the area has gotten worse.

## 2016-11-13 ENCOUNTER — Inpatient Hospital Stay (HOSPITAL_COMMUNITY): Payer: Worker's Compensation

## 2016-11-13 ENCOUNTER — Encounter (HOSPITAL_COMMUNITY): Payer: Self-pay | Admitting: General Practice

## 2016-11-13 LAB — BASIC METABOLIC PANEL
ANION GAP: 11 (ref 5–15)
BUN: 28 mg/dL — ABNORMAL HIGH (ref 6–20)
CALCIUM: 8.2 mg/dL — AB (ref 8.9–10.3)
CO2: 23 mmol/L (ref 22–32)
Chloride: 99 mmol/L — ABNORMAL LOW (ref 101–111)
Creatinine, Ser: 0.96 mg/dL (ref 0.44–1.00)
Glucose, Bld: 197 mg/dL — ABNORMAL HIGH (ref 65–99)
POTASSIUM: 4 mmol/L (ref 3.5–5.1)
Sodium: 133 mmol/L — ABNORMAL LOW (ref 135–145)

## 2016-11-13 LAB — GLUCOSE, CAPILLARY
GLUCOSE-CAPILLARY: 174 mg/dL — AB (ref 65–99)
GLUCOSE-CAPILLARY: 307 mg/dL — AB (ref 65–99)
Glucose-Capillary: 240 mg/dL — ABNORMAL HIGH (ref 65–99)
Glucose-Capillary: 391 mg/dL — ABNORMAL HIGH (ref 65–99)

## 2016-11-13 LAB — CBC
HEMATOCRIT: 35 % — AB (ref 36.0–46.0)
HEMOGLOBIN: 11.7 g/dL — AB (ref 12.0–15.0)
MCH: 26.2 pg (ref 26.0–34.0)
MCHC: 33.4 g/dL (ref 30.0–36.0)
MCV: 78.5 fL (ref 78.0–100.0)
Platelets: 411 10*3/uL — ABNORMAL HIGH (ref 150–400)
RBC: 4.46 MIL/uL (ref 3.87–5.11)
RDW: 15.3 % (ref 11.5–15.5)
WBC: 10.4 10*3/uL (ref 4.0–10.5)

## 2016-11-13 MED ORDER — INSULIN GLARGINE 100 UNIT/ML ~~LOC~~ SOLN
12.0000 [IU] | Freq: Every day | SUBCUTANEOUS | Status: DC
  Administered 2016-11-13 – 2016-11-19 (×7): 12 [IU] via SUBCUTANEOUS
  Filled 2016-11-13 (×8): qty 0.12

## 2016-11-13 MED ORDER — HYDRALAZINE HCL 20 MG/ML IJ SOLN: 10.0000 mg | Freq: Four times a day (QID) | INTRAMUSCULAR | Status: DC | PRN

## 2016-11-13 MED ORDER — SODIUM CHLORIDE 0.9 % IV SOLN
INTRAVENOUS | Status: AC
  Administered 2016-11-13: 23:00:00 via INTRAVENOUS

## 2016-11-13 MED ORDER — VANCOMYCIN HCL IN DEXTROSE 1-5 GM/200ML-% IV SOLN
1000.0000 mg | Freq: Two times a day (BID) | INTRAVENOUS | Status: DC
  Administered 2016-11-13 – 2016-11-16 (×7): 1000 mg via INTRAVENOUS
  Filled 2016-11-13 (×13): qty 200

## 2016-11-13 MED ORDER — IOPAMIDOL (ISOVUE-300) INJECTION 61%
75.0000 mL | Freq: Once | INTRAVENOUS | Status: AC | PRN
  Administered 2016-11-13: 75 mL via INTRAVENOUS

## 2016-11-13 NOTE — Progress Notes (Signed)
PROGRESS NOTE                                                                                                                                                                                                             Patient Demographics:    Pamela Fry, is a 56 y.o. female, DOB - 1961/06/14, RUE:[id number]  Admit date - 11/12/2016   Admitting Physician Haydee Monica, MD  Outpatient Primary MD for the patient is Pcp Not In System  LOS - 1  Chief Complaint  Patient presents with  . Wound Infection       Brief Narrative  Pamela Fry is a 56 y.o. female with medical history significant of DM, HTN was admitted with right leg cellulitis which appeared quite significant in the setting of diabetes mellitus type 2 on 11/12/2016.   Subjective:    Pamela Fry today has, No headache, No chest pain, No abdominal pain - No Nausea, No new weakness tingling or numbness, No Cough - SOB.     Assessment  & Plan :     1.Right leg cellulitis in a patient with type 2 diabetes mellitus - her right foot still looks pretty angry and infected, will get CT scan to rule out an abscess, there is an area of small fluctuance, her leukocytosis has responded well to vancomycin however she has diabetes mellitus hence we'll add Zosyn as well. Follow cultures. Follow clinically. If there is abscess surgery will be consulted.  2. Essential hypertension. On combination of metoprolol and Norvasc continue. ACE inhibitor on hold due to mild air upon admission.  3. ARF upon admission. Resolved, continue to hydrate gently as she cannot get CT contrast today. Hold ACE.  4. Dyslipidemia. On Lipitor continue.   5. DM type II. Currently on sliding scale will add low-dose Lantus for good control. Oral hypoglycemics on hold.  CBG (last 3)   Recent Labs  11/12/16 2209 11/13/16 0809  GLUCAP 264* 174*        Family Communication  :  None  Code Status  :  Full  Diet : Diet heart healthy/carb modified Room service appropriate? Yes; Fluid consistency: Thin   Disposition Plan  :  Home 3-4 days  Consults  :  None  Procedures  :    CT right lower extremity with contrast ordered  DVT Prophylaxis  :  Lovenox    Lab Results  Component Value Date   PLT 411 (H) 11/13/2016    Inpatient Medications  Scheduled Meds: . sodium chloride   Intravenous STAT  . amLODipine  10 mg Oral Daily  . aspirin EC  81 mg Oral Daily  . atorvastatin  10 mg Oral Daily  . enoxaparin (LOVENOX) injection  40 mg Subcutaneous Q24H  . insulin aspart  0-5 Units Subcutaneous QHS  . insulin aspart  0-9 Units Subcutaneous TID WC  . metoprolol  100 mg Oral BID  . vancomycin  1,000 mg Intravenous Q12H   Continuous Infusions: PRN Meds:.ondansetron **OR** ondansetron (ZOFRAN) IV, oxyCODONE  Antibiotics  :    Anti-infectives    Start     Dose/Rate Route Frequency Ordered Stop   11/13/16 0900  vancomycin (VANCOCIN) IVPB 1000 mg/200 mL premix     1,000 mg 200 mL/hr over 60 Minutes Intravenous Every 12 hours 11/13/16 0809     11/12/16 1945  vancomycin (VANCOCIN) IVPB 1000 mg/200 mL premix     1,000 mg 200 mL/hr over 60 Minutes Intravenous  Once 11/12/16 1942 11/12/16 2140         Objective:   Vitals:   11/12/16 1518 11/13/16 0145 11/13/16 0757  BP: 147/76 141/65 132/67  Pulse: 113 104 87  Resp: 19 20 20   Temp: 97.5 F (36.4 C) 98.1 F (36.7 C) 98.2 F (36.8 C)  TempSrc: Oral Oral Oral  SpO2: 99% 99% 99%  Weight: 94.3 kg (208 lb)  98.7 kg (217 lb 9.6 oz)  Height: 5' (1.524 m)  5' (1.524 m)    Wt Readings from Last 3 Encounters:  11/13/16 98.7 kg (217 lb 9.6 oz)  11/09/16 94.4 kg (208 lb 3.2 oz)  11/28/15 100.5 kg (221 lb 9 oz)     Intake/Output Summary (Last 24 hours) at 11/13/16 1113 Last data filed at 11/13/16 1033  Gross per 24 hour  Intake             1400 ml  Output              300 ml  Net             1100 ml     Physical  Exam  Awake Alert, Oriented X 3, No new F.N deficits, Normal affect [place].AT,PERRAL Supple Neck,No JVD, No cervical lymphadenopathy appriciated.  Symmetrical Chest wall movement, Good air movement bilaterally, CTAB RRR,No Gallops,Rubs or new Murmurs, No Parasternal Heave +ve B.Sounds, Abd Soft, No tenderness, No organomegaly appriciated, No rebound - guarding or rigidity. No Cyanosis, Clubbing or edema, No new Rash or bruise  R foot below      Data Review:    CBC  Recent Labs Lab 11/12/16 1742 11/13/16 0450  WBC 14.3* 10.4  HGB 11.9* 11.7*  HCT 35.5* 35.0*  PLT 482* 411*  MCV 79.4 78.5  MCH 26.6 26.2  MCHC 33.5 33.4  RDW 15.1 15.3  LYMPHSABS 2.0  --   MONOABS 1.0  --   EOSABS 0.1  --   BASOSABS 0.1  --     Chemistries   Recent Labs Lab 11/12/16 1742 11/13/16 0450  NA 129* 133*  K 3.8 4.0  CL 91* 99*  CO2 23 23  GLUCOSE 308* 197*  BUN 29* 28*  CREATININE 1.17* 0.96  CALCIUM 8.9 8.2*  AST 14*  --   ALT 17  --   ALKPHOS 87  --   BILITOT 1.0  --    ------------------------------------------------------------------------------------------------------------------  No results for input(s): CHOL, HDL, LDLCALC, TRIG, CHOLHDL, LDLDIRECT in the last 72 hours.  Lab Results  Component Value Date   HGBA1C 7.8 (H) 11/27/2015   ------------------------------------------------------------------------------------------------------------------ No results for input(s): TSH, T4TOTAL, T3FREE, THYROIDAB in the last 72 hours.  Invalid input(s): FREET3 ------------------------------------------------------------------------------------------------------------------ No results for input(s): VITAMINB12, FOLATE, FERRITIN, TIBC, IRON, RETICCTPCT in the last 72 hours.  Coagulation profile No results for input(s): INR, PROTIME in the last 168 hours.  No results for input(s): DDIMER in the last 72 hours.  Cardiac Enzymes No results for input(s): CKMB, TROPONINI, MYOGLOBIN in  the last 168 hours.  Invalid input(s): CK ------------------------------------------------------------------------------------------------------------------    Component Value Date/Time   BNP 270.0 (H) 11/27/2015 [id number]    Micro Results Recent Results (from the past 240 hour(s))  Blood culture (routine x 2)     Status: None (Preliminary result)   Collection Time: 11/12/16  7:42 PM  Result Value Ref Range Status   Specimen Description LEFT ANTECUBITAL  Final   Special Requests BOTTLES DRAWN AEROBIC AND ANAEROBIC 6CC EACH  Final   Culture NO GROWTH < 12 HOURS  Final   Report Status PENDING  Incomplete  Blood culture (routine x 2)     Status: None (Preliminary result)   Collection Time: 11/12/16  8:23 PM  Result Value Ref Range Status   Specimen Description BLOOD RIGHT FOREARM  Final   Special Requests BOTTLES DRAWN AEROBIC AND ANAEROBIC 6CC  Final   Culture NO GROWTH < 12 HOURS  Final   Report Status PENDING  Incomplete    Radiology Reports Dg Ankle Complete Right  Result Date: 11/09/2016 CLINICAL DATA:  Injury. EXAM: RIGHT ANKLE - COMPLETE 3+ VIEW COMPARISON:  No prior. FINDINGS: Diffuse soft tissue swelling. No acute bony or joint abnormality. Peripheral vascular calcification. IMPRESSION: 1.  Diffuse degenerative change.  No acute bony abnormality. 2.  Peripheral vascular disease . Electronically Signed   By: Maisie Fushomas  Register   On: 11/09/2016 14:07   Dg Foot Complete Right  Result Date: 11/12/2016 CLINICAL DATA:  Wound to top of foot, redness and swelling to the level of the mid tibia/fibula. Injury to right ankle and lower leg on 11/05/2016 with subsequent redness/drainage and increased swelling. EXAM: RIGHT FOOT COMPLETE - 3+ VIEW COMPARISON:  Plain film of the right foot and ankle dated 11/09/2016 FINDINGS: Osseous alignment remains normal. Overall bone mineralization is normal. No fracture line or displaced fracture fragment identified. No acute or suspicious osseous lesion. No  destructive change seen to suggest osteomyelitis. There is at least mild soft tissue thickening/edema over the dorsum of the midfoot. No soft tissue gas or air-fluid levels are identified. IMPRESSION: 1. Soft tissue swelling. 2. No acute or suspicious osseous finding. Electronically Signed   By: Bary RichardStan  Maynard M.D.   On: 11/12/2016 20:27   Dg Foot Complete Right  Result Date: 11/09/2016 CLINICAL DATA:  Injury. EXAM: RIGHT FOOT COMPLETE - 3+ VIEW COMPARISON:  No recent . FINDINGS: Diffuse soft tissue swelling. No acute bony or joint abnormality identified. No evidence of fracture or dislocation. Diffuse degenerative change. IMPRESSION: Diffuse soft-tissue swelling and degenerative change. No acute abnormality . Electronically Signed   By: Maisie Fushomas  Register   On: 11/09/2016 13:58    Time Spent in minutes  30   Susa RaringSINGH,Riata Ikeda K M.D on 11/13/2016 at 11:13 AM  Between 7am to 7pm - Pager - [phone]  After 7pm go to [email or web address] - password [email or web address]  [facility] -  Office  [phone]

## 2016-11-13 NOTE — Progress Notes (Signed)
Pharmacy Antibiotic Note  Pamela SacramentoRobin L Griffiths is a 56 y.o. female admitted on 11/12/2016 with cellulitis.  Pharmacy has been consulted for Vancomycin dosing.  Plan: Vancomycin 1000mg   IV every 12 hours.  Goal trough 10-15 mcg/mL.  F/U clinical progress and cultures Monitor V/S, labs, and levels as indicated  Height: 5' (152.4 cm) Weight: 217 lb 9.6 oz (98.7 kg) IBW/kg (Calculated) : 45.5  Temp (24hrs), Avg:97.9 F (36.6 C), Min:97.5 F (36.4 C), Max:98.2 F (36.8 C)   Recent Labs Lab 11/12/16 1742 11/12/16 2048 11/13/16 0450  WBC 14.3*  --  10.4  CREATININE 1.17*  --  0.96  LATICACIDVEN  --  2.16*  --     Estimated Creatinine Clearance: 68.2 mL/min (by C-G formula based on SCr of 0.96 mg/dL).    No Known Allergies  Antimicrobials this admission: Vancomycin 1/30 >>   Dose adjustments this admission: n/a  Microbiology results: 1/30 BCx: pending  Thank you for allowing pharmacy to be a part of this patient's care.  Elder CyphersLorie Olegario Emberson, BS Pharm D, BCPS Clinical Pharmacist Pager [phone] 11/13/2016 8:10 AM

## 2016-11-14 LAB — CBC
HCT: 32.5 % — ABNORMAL LOW (ref 36.0–46.0)
Hemoglobin: 10.7 g/dL — ABNORMAL LOW (ref 12.0–15.0)
MCH: 26.5 pg (ref 26.0–34.0)
MCHC: 32.9 g/dL (ref 30.0–36.0)
MCV: 80.4 fL (ref 78.0–100.0)
PLATELETS: 446 10*3/uL — AB (ref 150–400)
RBC: 4.04 MIL/uL (ref 3.87–5.11)
RDW: 15.3 % (ref 11.5–15.5)
WBC: 9.8 10*3/uL (ref 4.0–10.5)

## 2016-11-14 LAB — BASIC METABOLIC PANEL
ANION GAP: 9 (ref 5–15)
BUN: 19 mg/dL (ref 6–20)
CALCIUM: 8.2 mg/dL — AB (ref 8.9–10.3)
CO2: 25 mmol/L (ref 22–32)
CREATININE: 0.79 mg/dL (ref 0.44–1.00)
Chloride: 102 mmol/L (ref 101–111)
Glucose, Bld: 160 mg/dL — ABNORMAL HIGH (ref 65–99)
Potassium: 4 mmol/L (ref 3.5–5.1)
SODIUM: 136 mmol/L (ref 135–145)

## 2016-11-14 LAB — GLUCOSE, CAPILLARY
GLUCOSE-CAPILLARY: 137 mg/dL — AB (ref 65–99)
GLUCOSE-CAPILLARY: 203 mg/dL — AB (ref 65–99)
Glucose-Capillary: 152 mg/dL — ABNORMAL HIGH (ref 65–99)
Glucose-Capillary: 354 mg/dL — ABNORMAL HIGH (ref 65–99)

## 2016-11-14 NOTE — Progress Notes (Signed)
PROGRESS NOTE                                                                                                                                                                                                             Patient Demographics:    Pamela Fry, is a 56 y.o. female, DOB - 1961/09/05, OZH:[id number]  Admit date - 11/12/2016   Admitting Physician Haydee Monica, MD  Outpatient Primary MD for the patient is Pcp Not In System  LOS - 2  Chief Complaint  Patient presents with  . Wound Infection       Brief Narrative  Pamela Fry is a 56 y.o. female with medical history significant of DM, HTN was admitted with right leg cellulitis which appeared quite significant in the setting of diabetes mellitus type 2 on 11/12/2016.   Subjective:    Pamela Fry today has, No headache, No chest pain, No abdominal pain - No Nausea, No new weakness tingling or numbness, No Cough - SOB.     Assessment  & Plan :     1.Right leg cellulitis in a patient with type 2 diabetes mellitus - her right foot still looks pretty angry and infected, CT scan of the right foot for now rules out an abscess but she is a area of small fluctuance developing which may developed into an abscess, MICU she has responded very well to vancomycin and Zosyn and cellulitis is improving. Follow cultures. If an abscess developes consulted surgery.  2. Essential hypertension. On combination of metoprolol and Norvasc continue. ACE inhibitor on hold due to mild air upon admission.  3. ARF upon admission. Resolved post IVF. Holding ACE.  4. Dyslipidemia. On Lipitor continue.   5. DM type II. Currently on sliding scale , have added low-dose Lantus for good control. Oral hypoglycemics on hold.  CBG (last 3)   Recent Labs  11/13/16 1609 11/13/16 1956 11/14/16 0828  GLUCAP 240* 391* 137*    Family Communication  :  None  Code Status :  Full  Diet : Diet  heart healthy/carb modified Room service appropriate? Yes; Fluid consistency: Thin   Disposition Plan  :  Home 3-4 days  Consults  :  None  Procedures  :    CT right lower extremity with contrast ordered  DVT Prophylaxis  :  Lovenox  Lab Results  Component Value Date   PLT 446 (H) 11/14/2016    Inpatient Medications  Scheduled Meds: . amLODipine  10 mg Oral Daily  . aspirin EC  81 mg Oral Daily  . atorvastatin  10 mg Oral Daily  . enoxaparin (LOVENOX) injection  40 mg Subcutaneous Q24H  . insulin aspart  0-5 Units Subcutaneous QHS  . insulin aspart  0-9 Units Subcutaneous TID WC  . insulin glargine  12 Units Subcutaneous Daily  . metoprolol  100 mg Oral BID  . vancomycin  1,000 mg Intravenous Q12H   Continuous Infusions: . sodium chloride 50 mL/hr at 11/14/16 0600   PRN Meds:.hydrALAZINE, ondansetron **OR** ondansetron (ZOFRAN) IV, oxyCODONE  Antibiotics  :    Anti-infectives    Start     Dose/Rate Route Frequency Ordered Stop   11/13/16 0900  vancomycin (VANCOCIN) IVPB 1000 mg/200 mL premix     1,000 mg 200 mL/hr over 60 Minutes Intravenous Every 12 hours 11/13/16 0809     11/12/16 1945  vancomycin (VANCOCIN) IVPB 1000 mg/200 mL premix     1,000 mg 200 mL/hr over 60 Minutes Intravenous  Once 11/12/16 1942 11/12/16 2140         Objective:   Vitals:   11/13/16 0145 11/13/16 0757 11/13/16 1959 11/14/16 0632  BP: 141/65 132/67 (!) 128/52 (!) 144/66  Pulse: 104 87 94 82  Resp: 20 20 20 20   Temp: 98.1 F (36.7 C) 98.2 F (36.8 C) 98.4 F (36.9 C) 97.9 F (36.6 C)  TempSrc: Oral Oral Oral Oral  SpO2: 99% 99% 96% 94%  Weight:  98.7 kg (217 lb 9.6 oz)  98 kg (216 lb)  Height:  5' (1.524 m)      Wt Readings from Last 3 Encounters:  11/14/16 98 kg (216 lb)  11/09/16 94.4 kg (208 lb 3.2 oz)  11/28/15 100.5 kg (221 lb 9 oz)     Intake/Output Summary (Last 24 hours) at 11/14/16 0846 Last data filed at 11/14/16 0600  Gross per 24 hour  Intake              1565 ml  Output             1900 ml  Net             -335 ml     Physical Exam  Awake Alert, Oriented X 3, No new F.N deficits, Normal affect [place].AT,PERRAL Supple Neck,No JVD, No cervical lymphadenopathy appriciated.  Symmetrical Chest wall movement, Good air movement bilaterally, CTAB RRR,No Gallops,Rubs or new Murmurs, No Parasternal Heave +ve B.Sounds, Abd Soft, No tenderness, No organomegaly appriciated, No rebound - guarding or rigidity. No Cyanosis, Clubbing or edema, No new Rash or bruise  R foot below      Data Review:    CBC  Recent Labs Lab 11/12/16 1742 11/13/16 0450 11/14/16 0504  WBC 14.3* 10.4 9.8  HGB 11.9* 11.7* 10.7*  HCT 35.5* 35.0* 32.5*  PLT 482* 411* 446*  MCV 79.4 78.5 80.4  MCH 26.6 26.2 26.5  MCHC 33.5 33.4 32.9  RDW 15.1 15.3 15.3  LYMPHSABS 2.0  --   --   MONOABS 1.0  --   --   EOSABS 0.1  --   --   BASOSABS 0.1  --   --     Chemistries   Recent Labs Lab 11/12/16 1742 11/13/16 0450 11/14/16 0504  NA 129* 133* 136  K 3.8 4.0 4.0  CL 91* 99* 102  CO2 23 23 25   GLUCOSE 308* 197* 160*  BUN 29* 28* 19  CREATININE 1.17* 0.96 0.79  CALCIUM 8.9 8.2* 8.2*  AST 14*  --   --   ALT 17  --   --   ALKPHOS 87  --   --   BILITOT 1.0  --   --    ------------------------------------------------------------------------------------------------------------------ No results for input(s): CHOL, HDL, LDLCALC, TRIG, CHOLHDL, LDLDIRECT in the last 72 hours.  Lab Results  Component Value Date   HGBA1C 7.8 (H) 11/27/2015   ------------------------------------------------------------------------------------------------------------------ No results for input(s): TSH, T4TOTAL, T3FREE, THYROIDAB in the last 72 hours.  Invalid input(s): FREET3 ------------------------------------------------------------------------------------------------------------------ No results for input(s): VITAMINB12, FOLATE, FERRITIN, TIBC, IRON, RETICCTPCT in the last  72 hours.  Coagulation profile No results for input(s): INR, PROTIME in the last 168 hours.  No results for input(s): DDIMER in the last 72 hours.  Cardiac Enzymes No results for input(s): CKMB, TROPONINI, MYOGLOBIN in the last 168 hours.  Invalid input(s): CK ------------------------------------------------------------------------------------------------------------------    Component Value Date/Time   BNP 270.0 (H) 11/27/2015 16100925    Micro Results Recent Results (from the past 240 hour(s))  Blood culture (routine x 2)     Status: None (Preliminary result)   Collection Time: 11/12/16  7:42 PM  Result Value Ref Range Status   Specimen Description LEFT ANTECUBITAL  Final   Special Requests BOTTLES DRAWN AEROBIC AND ANAEROBIC 6CC EACH  Final   Culture NO GROWTH 2 DAYS  Final   Report Status PENDING  Incomplete  Blood culture (routine x 2)     Status: None (Preliminary result)   Collection Time: 11/12/16  8:23 PM  Result Value Ref Range Status   Specimen Description BLOOD RIGHT FOREARM  Final   Special Requests BOTTLES DRAWN AEROBIC AND ANAEROBIC 6CC  Final   Culture NO GROWTH 2 DAYS  Final   Report Status PENDING  Incomplete    Radiology Reports Dg Ankle Complete Right  Result Date: 11/09/2016 CLINICAL DATA:  Injury. EXAM: RIGHT ANKLE - COMPLETE 3+ VIEW COMPARISON:  No prior. FINDINGS: Diffuse soft tissue swelling. No acute bony or joint abnormality. Peripheral vascular calcification. IMPRESSION: 1.  Diffuse degenerative change.  No acute bony abnormality. 2.  Peripheral vascular disease . Electronically Signed   By: Maisie Fushomas  Register   On: 11/09/2016 14:07   Ct Foot Right W Contrast  Result Date: 11/13/2016 CLINICAL DATA:  Right lower ankle pain and leg pain. Redness and drainage. EXAM: CT OF THE LOWER RIGHT EXTREMITY WITH CONTRAST TECHNIQUE: Multidetector CT imaging of the lower right extremity was performed according to the standard protocol following intravenous contrast  administration. COMPARISON:  None. CONTRAST:  75mL ISOVUE-300 IOPAMIDOL (ISOVUE-300) INJECTION 61% FINDINGS: Bones/Joint/Cartilage No acute fracture or dislocation. Mild osteoarthritis of the talonavicular joint head navicular-medial cuneiform joint. Plantar calcaneal spur. Enthesopathic changes of the Achilles tendon insertion. Mild osteoarthritis of the second, third, fourth tarsometatarsal joints. Ligaments Suboptimally assessed by CT. Muscles and Tendons The muscles are normal. Intact flexor, extensor, peroneal and Achilles tendons. Soft tissues Severe soft tissue swelling throughout the right ankle and foot concerning for cellulitis. No focal fluid collection to suggest an abscess. IMPRESSION: 1. Extensive cellulitis of the right ankle and foot. Electronically Signed   By: Elige KoHetal  Patel   On: 11/13/2016 15:34   Dg Foot Complete Right  Result Date: 11/12/2016 CLINICAL DATA:  Wound to top of foot, redness and swelling to the level of the mid tibia/fibula. Injury to right ankle and lower  leg on 11/05/2016 with subsequent redness/drainage and increased swelling. EXAM: RIGHT FOOT COMPLETE - 3+ VIEW COMPARISON:  Plain film of the right foot and ankle dated 11/09/2016 FINDINGS: Osseous alignment remains normal. Overall bone mineralization is normal. No fracture line or displaced fracture fragment identified. No acute or suspicious osseous lesion. No destructive change seen to suggest osteomyelitis. There is at least mild soft tissue thickening/edema over the dorsum of the midfoot. No soft tissue gas or air-fluid levels are identified. IMPRESSION: 1. Soft tissue swelling. 2. No acute or suspicious osseous finding. Electronically Signed   By: Bary Richard M.D.   On: 11/12/2016 20:27   Dg Foot Complete Right  Result Date: 11/09/2016 CLINICAL DATA:  Injury. EXAM: RIGHT FOOT COMPLETE - 3+ VIEW COMPARISON:  No recent . FINDINGS: Diffuse soft tissue swelling. No acute bony or joint abnormality identified. No evidence  of fracture or dislocation. Diffuse degenerative change. IMPRESSION: Diffuse soft-tissue swelling and degenerative change. No acute abnormality . Electronically Signed   By: Maisie Fus  Register   On: 11/09/2016 13:58    Time Spent in minutes  30   Susa Raring K M.D on 11/14/2016 at 8:46 AM  Between 7am to 7pm - Pager - [phone]  After 7pm go to [email or web address] - password [facility]  [facility] -  Office  [phone]

## 2016-11-14 NOTE — Care Management Note (Signed)
Case Management Note  Patient Details  Name: Pamela Fry MRN: [id number] Date of Birth: 05/19/1961  Subjective/Objective:    Patient adm from home with cellulitis. She is ind with ADL's, works at [employer] and dropped box on foot causing cellulitis. Workers comp requesting H&P and radiology reports faxed.            Action/Plan: CM will follow up with CMA regarding faxing clinicals. No other CM needs known at this time. CM following.    Expected Discharge Date:     11/14/2016             Expected Discharge Plan:  Home/Self Care  In-House Referral:  NA  Discharge planning Services  CM Consult  Post Acute Care Choice:  NA Choice offered to:  NA  DME Arranged:    DME Agency:     HH Arranged:    HH Agency:     Status of Service:  In process, will continue to follow  If discussed at Long Length of Stay Meetings, dates discussed:    Additional Comments:  Pamela Fry, Pamela OilerSharley Diane, RN 11/14/2016, 8:41 AM

## 2016-11-14 NOTE — Progress Notes (Signed)
Results for Christain SacramentoKING, Anasofia L (MRN [id number]) as of 11/14/2016 10:01  Ref. Range 11/13/2016 08:09 11/13/2016 11:35 11/13/2016 16:09 11/13/2016 19:56 11/14/2016 08:28  Glucose-Capillary Latest Ref Range: 65 - 99 mg/dL 409174 (H) 811307 (H) 914240 (H) 391 (H) 137 (H)  Noted that postprandial blood sugars have been greater than 180 mg/dl. Recommend adding Novolog 3-4 units TID as meal coverage if patient eats at least 50% of meals and if postprandials continue to be elevated.  Will continue to monitor blood sugars while in the hospital. Smith MinceKendra Kylle Lall RN BSN CDE

## 2016-11-15 LAB — GLUCOSE, CAPILLARY
GLUCOSE-CAPILLARY: 249 mg/dL — AB (ref 65–99)
GLUCOSE-CAPILLARY: 262 mg/dL — AB (ref 65–99)
Glucose-Capillary: 153 mg/dL — ABNORMAL HIGH (ref 65–99)
Glucose-Capillary: 222 mg/dL — ABNORMAL HIGH (ref 65–99)

## 2016-11-15 MED ORDER — PIPERACILLIN-TAZOBACTAM 3.375 G IVPB 30 MIN
3.3750 g | Freq: Once | INTRAVENOUS | Status: AC
  Administered 2016-11-15: 3.375 g via INTRAVENOUS
  Filled 2016-11-15: qty 50

## 2016-11-15 MED ORDER — PIPERACILLIN-TAZOBACTAM 3.375 G IVPB
3.3750 g | Freq: Three times a day (TID) | INTRAVENOUS | Status: DC
  Administered 2016-11-15 – 2016-11-18 (×11): 3.375 g via INTRAVENOUS
  Filled 2016-11-15 (×16): qty 50

## 2016-11-15 NOTE — Care Management (Addendum)
CM left message with workers comp CM [facility]. [facility]   Later entry : 11/16/2016 1050 : No call back from workers comp CM  yet. This CM faxed WOC note and PT note to Workers comp CM.    1612: CM has received message for worker's comp CM [facility]. This CM has faxed necessary orders.   IF RN needs to contact worker's comp CM, please see sticky note for numbers.

## 2016-11-15 NOTE — Evaluation (Signed)
Physical Therapy Evaluation Patient Details Name: Pamela Fry MRN: [id number] DOB: 01/11/1961 Today's Date: 11/15/2016   History of Present Illness  Pamela Fry is a 56 y.o. female with medical history significant of DM, HTN comes in with 2 days of progressive worsening swelling and redness to her right forefoot.  She was started on abx yesterday by outpatient doctor but it has gotten worse.  Has not been on abx prior to yesterday.  No recent injuries to the foot.  Since it started swelling and getting red it has developed a superficial wound and crack that was not there before 2 days ago.  She is referred for treatment of cellulitis.  Clinical Impression  Pt received in her bed and willing to participate in PT evaluation. She reported no pain other than during weight bearing activity. She was able to perform all bed mobility with modified independence, largely limited by her RLE. Once sitting at EOB, PT educated pt on the importance of using the RW to offload her RLE during mobility. Pt was able to transition without difficulty and was able to ambulate 15 ft in her room to the bedside chair with CGA for safety. She demonstrates good understanding and use of the RW during ambulation and I feel she would benefit from a RW with discharge home. Pt will continue to be seen by PT while at [facility] to further improve her mobility.     Follow Up Recommendations Supervision - Intermittent;No PT follow up    Equipment Recommendations  Rolling walker with 5" wheels;Other (comment) (Pt is unsure if she has a RW)    Recommendations for Other Services       Precautions / Restrictions Precautions Precautions: None Restrictions Weight Bearing Restrictions: No      Mobility  Bed Mobility Overal bed mobility: Modified Independent             General bed mobility comments: increased time secondary to favoring RLE  Transfers Overall transfer level: Modified independent Equipment used: Rolling walker  (2 wheeled)             General transfer comment: Able to stand with CGA, minimal weight bearing on RLE, heavy use of UE for support on RW  Ambulation/Gait Ambulation/Gait assistance: Min guard;Supervision Ambulation Distance (Feet): 15 Feet Assistive device: Rolling walker (2 wheeled) Gait Pattern/deviations: Step-to pattern;Decreased step length - left;Decreased stance time - right;Trunk flexed   Gait velocity interpretation: <1.8 ft/sec, indicative of risk for recurrent falls General Gait Details: Lt step to pattern to avoid weight bearing on her RLE, able to use RW with minimal cues for technique   Stairs            Wheelchair Mobility    Modified Rankin (Stroke Patients Only)       Balance Overall balance assessment: Needs assistance Sitting-balance support: Feet supported Sitting balance-Leahy Scale: Good     Standing balance support: Bilateral upper extremity supported Standing balance-Leahy Scale: Good                               Pertinent Vitals/Pain Pain Assessment: No/denies pain (Pain with weight bearing activity only and monitered during evaluation)    Home Living Family/patient expects to be discharged to:: Private residence Living Arrangements: Children Available Help at Discharge: Family;Available 24 hours/day Type of Home: House Home Access: Ramped entrance     Home Layout: One level Home Equipment:  (walking stick) Additional Comments: Pt  thinks she might have a RW, but is unsure     Prior Function Level of Independence: Independent         Comments: helps take care of her 2 grandchildren, works full time [employer]      Hand Dominance        Extremity/Trunk Assessment   Upper Extremity Assessment Upper Extremity Assessment: Generalized weakness    Lower Extremity Assessment Lower Extremity Assessment: Generalized weakness       Communication   Communication: No difficulties  Cognition  Arousal/Alertness: Awake/alert Behavior During Therapy: WFL for tasks assessed/performed Overall Cognitive Status: Within Functional Limits for tasks assessed                      General Comments General comments (skin integrity, edema, etc.): Redness, swelling of Rt foot; wound on dorsum of Rt foot currently being monitored by Shon HaleMary Beth RN    Exercises     Assessment/Plan    PT Assessment Patient needs continued PT services  PT Problem List Decreased strength;Decreased activity tolerance;Decreased balance;Decreased mobility;Pain;Decreased safety awareness          PT Treatment Interventions Gait training;Functional mobility training;Therapeutic activities;Therapeutic exercise;Balance training;Modalities;Patient/family education;Neuromuscular re-education;DME instruction    PT Goals (Current goals can be found in the Care Plan section)  Acute Rehab PT Goals Patient Stated Goal: get better to go home  PT Goal Formulation: With patient Time For Goal Achievement: 12/13/16 Potential to Achieve Goals: Good    Frequency Min 3X/week   Barriers to discharge   none, pt has all necessary assistance available at home     Co-evaluation               End of Session Equipment Utilized During Treatment: Gait belt Activity Tolerance: Patient limited by pain Patient left: in chair;with call bell/phone within reach Nurse Communication: Mobility status    Functional Assessment Tool Used: Clinical judgement based on assessment of mobility and activity tolerance  Functional Limitation: Mobility: Walking and moving around Mobility: Walking and Moving Around Current Status ([id number](G8978): At least 20 percent but less than 40 percent impaired, limited or restricted Mobility: Walking and Moving Around Goal Status [phone](G8979): At least 1 percent but less than 20 percent impaired, limited or restricted    Time: 1410-1427 PT Time Calculation (min) (ACUTE ONLY): 17 min   Charges:   PT  Evaluation $PT Eval Low Complexity: 1 Procedure PT Treatments $Therapeutic Activity: 8-22 mins   PT G Codes:   PT G-Codes **NOT FOR INPATIENT CLASS** Functional Assessment Tool Used: Clinical judgement based on assessment of mobility and activity tolerance  Functional Limitation: Mobility: Walking and moving around Mobility: Walking and Moving Around Current Status (U9811(G8978): At least 20 percent but less than 40 percent impaired, limited or restricted Mobility: Walking and Moving Around Goal Status [phone](G8979): At least 1 percent but less than 20 percent impaired, limited or restricted    2:53 PM,11/15/16 Marylyn IshiharaSara Kiser PT, DPT [facility] [phone]

## 2016-11-15 NOTE — Consult Note (Addendum)
WOC Nurse wound consult note Reason for Consult: Consult requested for right leg and foot.  Pt was admitted for cellulitis and a wound, appearance is consistent with a previous blister which has evolved into full thickness tissue loss. Consult was performed by telephone with the bedside nurse who states she will  obtain measurements of the affected area and enter them into the wound flow sheet, and assessment was completed by observing the photo which is in the EMR.  Wound type: RLE with generalized edema and erythremia.  Pt is on IV antibiotics for the cellulitis. 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 Wound bed: 50% moist brown crusted scabs, 50% moist yellow gelatinous tissue which has not fully evolved. Drainage (amount, consistency, odor) Mod amt yellow drainage, no odor Loose peeling skin surrounding Dressing procedure/placement/frequency: Xerofrom to promote drying and healing of the affected area.  Float heels to reduce pressure.  Pt could benefit from home health assistance after discharge to assist with dressing changes, please order if desired.  Topical treatment orders provided for the staff nurse and she verbalized understanding. Please re-consult if further assistance is needed.  Thank-you,  Cammie Mcgeeawn Autry Droege MSN, RN, CWOCN, [place]-AP, CNS [phone]

## 2016-11-15 NOTE — Progress Notes (Signed)
Inpatient Diabetes Program Recommendations  AACE/ADA: New Consensus Statement on Inpatient Glycemic Control (2015)  Target Ranges:  Prepandial:   less than 140 mg/dL      Peak postprandial:   less than 180 mg/dL (1-2 hours)      Critically ill patients:  140 - 180 mg/dL   Results for Pamela Fry, Pamela Fry (MRN [id number]) as of 11/15/2016 11:48  Ref. Range 11/14/2016 08:28 11/14/2016 11:56 11/14/2016 17:11 11/14/2016 20:04 11/15/2016 08:09 11/15/2016 11:13  Glucose-Capillary Latest Ref Range: 65 - 99 mg/dL 409137 (H) 811203 (H) 914152 (H) 354 (H) 153 (H) 249 (H)   Review of Glycemic Control  Diabetes history: DM2 Outpatient Diabetes medications: Invokamet XR [id number] mg BID, Glipizide XL 10 mg daily, Victoza 1.8 mg daily Current orders for Inpatient glycemic control: Lantus 12 units daily, Novolog 0-9 units TID with meals, Novolog 0-5 units QHS  Inpatient Diabetes Program Recommendations:  Insulin - Meal Coverage: If patient is eating at least 50% of meals, please consider ordering Novolog 4 units TID with meals for meal coverage.  Thanks, Orlando PennerMarie Travonna Swindle, RN, MSN, CDE Diabetes Coordinator Inpatient Diabetes Program [phone] (Team Pager from 8am to 5pm)

## 2016-11-15 NOTE — Progress Notes (Signed)
Pharmacy Antibiotic Note  Pamela SacramentoRobin L Daponte is a 56 y.o. female admitted on 11/12/2016 with cellulitis.  Pharmacy has been consulted for Vancomycin  And Zosyn dosing.  Plan: Continue Vancomycin 1000mg   IV every 12 hours.  Goal trough 10-15 mcg/mL.  Zosyn 3.375gm IV q8h , EID over 4 hours F/U clinical progress and cultures Monitor V/S, labs, and levels as indicated  Height: 5' (152.4 cm) Weight: 216 lb (98 kg) IBW/kg (Calculated) : 45.5  Temp (24hrs), Avg:98.7 F (37.1 C), Min:98.7 F (37.1 C), Max:98.7 F (37.1 C)   Recent Labs Lab 11/12/16 1742 11/12/16 2048 11/13/16 0450 11/14/16 0504  WBC 14.3*  --  10.4 9.8  CREATININE 1.17*  --  0.96 0.79  LATICACIDVEN  --  2.16*  --   --     Estimated Creatinine Clearance: 81.5 mL/min (by C-G formula based on SCr of 0.79 mg/dL).    No Known Allergies  Antimicrobials this admission: Vancomycin 1/30 >>  Zosyn 2/1>>   Dose adjustments this admission: n/a  Microbiology results: 1/30 BCx: ngtd  Thank you for allowing pharmacy to be a part of this patient's care.  Elder CyphersLorie Sholonda Jobst, BS Pharm D, [place] Clinical Pharmacist Pager [phone] 11/15/2016 8:57 AM

## 2016-11-15 NOTE — Progress Notes (Signed)
PROGRESS NOTE    Pamela Fry  LOV:[id number] DOB: 05-May-1961 DOA: 11/12/2016 PCP: Pcp Not In System   Brief Narrative:  56 yo female with T2DM, HTN, presents with right foot erythema and edema. Worsening symptoms for the last 2 days before hospitalization. Failed out patient antibiotics po. On the initial examination found significant erythema and edema at the right lower extremity. Admitted with the working diagnosis of cellulitis, started on broad spectrum antibiotic IV, CT with no abscess formation.   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 Right foot cellulitis. Extensive cellulitis of the right lower extremity. Will continue IV vancomycin, considering patient is diabetic will add gram negative coverage with Zosyn, will continue to follow on cell count and temperature curve. Will consult wound care team and physical therapy. Pain control with oxycodone. CT with no deep abscess.   2. T2DM. Will continue glucose cover and monitoring with iss, capillary glucose 152 and 249. Patient tolerating po well. Continue base insulin with 12 units of lantus.   3. HTN. Continue blood pressure control with metoprolol and amlodipine, as needed hydralazine, blood pressure systolic 120 to 140. Continue asa.   4. AKI. Improved renal function with cr down to 0.79 with k at 4,0 and serum bicarb at 25, no IV fluids, follow on vancomycin levels per pharmacy protocol.   5. Dyslipidemia. Continue statin therapy.    DVT prophylaxis: enoxaparin  Code Status: full  Family Communication: No family at the bedside Disposition Plan: Home   Consultants:   None  Procedures:    Antimicrobials:   Vancomycin   Zosyn     Subjective: Patient feeling better, edema and erythema per patient are improving. No nausea or vomiting. Ambulating with difficulty. No dyspnea or chest pain.    Objective: Vitals:   11/13/16 1959 11/14/16 0632 11/14/16 1551 11/14/16 2226  BP: (!) 128/52 (!) 144/66 135/67 140/69  Pulse: 94 82 90 93  Resp: 20 20 20 20   Temp: 98.4 F (36.9 C) 97.9 F (36.6 C) 98.7 F (37.1 C) 98.7 F (37.1 C)  TempSrc: Oral Oral Oral Oral  SpO2: 96% 94% 94% 93%  Weight:  98 kg (216 lb)    Height:        Intake/Output Summary (Last 24 hours) at 11/15/16 0835 Last data filed at 11/15/16 0100  Gross per 24 hour  Intake             1180 ml  Output             1900 ml  Net             -720 ml   Filed Weights   11/12/16 1518 11/13/16 0757 11/14/16 8841  Weight: 94.3 kg (208 lb) 98.7 kg (217 lb 9.6 oz) 98 kg (216 lb)    Examination:  General exam: Not in pain or dyspnea E ENT: no pallor or icterus, oral mucosa moist.    Respiratory system: Clear to auscultation. Respiratory effort normal. No wheezing rales or rhonchi.  Cardiovascular system: S1 & S2 heard, RRR. No JVD, murmurs, rubs, gallops or clicks. Right foot edema. Gastrointestinal system: Abdomen is nondistended, soft and nontender. No organomegaly or masses felt. Normal bowel sounds heard. Central nervous system: Alert and oriented. No focal neurological deficits. Extremities: Symmetric 5 x 5 power. Skin: Right foot with edema and erythema, ulcerated  lesion about 4 to 5 cm diameter, unable to stage present on the anterior foot. Proximal erythema below markings.     Data Reviewed: I have personally reviewed following labs and imaging studies  CBC:  Recent Labs Lab 11/12/16 1742 11/13/16 0450 11/14/16 0504  WBC 14.3* 10.4 9.8  NEUTROABS 11.2*  --   --   HGB 11.9* 11.7* 10.7*  HCT 35.5* 35.0* 32.5*  MCV 79.4 78.5 80.4  PLT 482* 411* 446*   Basic Metabolic Panel:  Recent Labs Lab 11/12/16 1742 11/13/16 0450 11/14/16 0504  NA 129* 133* 136  K 3.8 4.0 4.0  CL 91* 99* 102  CO2 23 23 25   GLUCOSE 308* 197* 160*  BUN 29* 28* 19  CREATININE 1.17* 0.96 0.79  CALCIUM 8.9 8.2* 8.2*    GFR: Estimated Creatinine Clearance: 81.5 mL/min (by C-G formula based on SCr of 0.79 mg/dL). Liver Function Tests:  Recent Labs Lab 11/12/16 1742  AST 14*  ALT 17  ALKPHOS 87  BILITOT 1.0  PROT 9.8*  ALBUMIN 3.3*   No results for input(s): LIPASE, AMYLASE in the last 168 hours. No results for input(s): AMMONIA in the last 168 hours. Coagulation Profile: No results for input(s): INR, PROTIME in the last 168 hours. Cardiac Enzymes: No results for input(s): CKTOTAL, CKMB, CKMBINDEX, TROPONINI in the last 168 hours. BNP (last 3 results) No results for input(s): PROBNP in the last 8760 hours. HbA1C: No results for input(s): HGBA1C in the last 72 hours. CBG:  Recent Labs Lab 11/14/16 0828 11/14/16 1156 11/14/16 1711 11/14/16 2004 11/15/16 0809  GLUCAP 137* 203* 152* 354* 153*   Lipid Profile: No results for input(s): CHOL, HDL, LDLCALC, TRIG, CHOLHDL, LDLDIRECT in the last 72 hours. Thyroid Function Tests: No results for input(s): TSH, T4TOTAL, FREET4, T3FREE, THYROIDAB in the last 72 hours. Anemia Panel: No results for input(s): VITAMINB12, FOLATE, FERRITIN, TIBC, IRON, RETICCTPCT in the last 72 hours. Sepsis Labs:  Recent Labs Lab 11/12/16 2048  LATICACIDVEN 2.16*    Recent Results (from the past 240 hour(s))  Blood culture (routine x 2)     Status: None (Preliminary result)   Collection Time: 11/12/16  7:42 PM  Result Value Ref Range Status   Specimen Description LEFT ANTECUBITAL  Final   Special Requests BOTTLES DRAWN AEROBIC AND ANAEROBIC 6CC EACH  Final   Culture NO GROWTH 3 DAYS  Final   Report Status PENDING  Incomplete  Blood culture (routine x 2)     Status: None (Preliminary result)   Collection Time: 11/12/16  8:23 PM  Result Value Ref Range Status   Specimen Description BLOOD RIGHT FOREARM  Final   Special Requests BOTTLES DRAWN AEROBIC AND ANAEROBIC 6CC  Final   Culture NO GROWTH 3 DAYS  Final   Report Status PENDING  Incomplete          Radiology Studies: Ct Foot Right W Contrast  Result Date: 11/13/2016 CLINICAL DATA:  Right lower ankle pain and leg pain. Redness and drainage. EXAM: CT OF THE LOWER RIGHT EXTREMITY WITH CONTRAST TECHNIQUE: Multidetector CT imaging of the lower right extremity was performed according to the standard protocol following intravenous contrast administration. COMPARISON:  None. CONTRAST:  75mL ISOVUE-300 IOPAMIDOL (ISOVUE-300) INJECTION 61% FINDINGS: Bones/Joint/Cartilage No acute fracture or dislocation. Mild osteoarthritis of the talonavicular joint head navicular-medial cuneiform joint. Plantar calcaneal spur. Enthesopathic changes of the Achilles tendon insertion. Mild osteoarthritis of the second, third, fourth tarsometatarsal joints. Ligaments Suboptimally assessed by CT. Muscles and Tendons The  muscles are normal. Intact flexor, extensor, peroneal and Achilles tendons. Soft tissues Severe soft tissue swelling throughout the right ankle and foot concerning for cellulitis. No focal fluid collection to suggest an abscess. IMPRESSION: 1. Extensive cellulitis of the right ankle and foot. Electronically Signed   By: Elige KoHetal  Patel   On: 11/13/2016 15:34        Scheduled Meds: . amLODipine  10 mg Oral Daily  . aspirin EC  81 mg Oral Daily  . atorvastatin  10 mg Oral Daily  . enoxaparin (LOVENOX) injection  40 mg Subcutaneous Q24H  . insulin aspart  0-5 Units Subcutaneous QHS  . insulin aspart  0-9 Units Subcutaneous TID WC  . insulin glargine  12 Units Subcutaneous Daily  . metoprolol  100 mg Oral BID  . vancomycin  1,000 mg Intravenous Q12H   Continuous Infusions:   LOS: 3 days        Mauricio Annett Gulaaniel Arrien, MD [facility] Pager [phone]  If 7PM-7AM, please contact night-coverage [email or web address] Password [email or web address] 11/15/2016, 8:35 AM

## 2016-11-16 ENCOUNTER — Other Ambulatory Visit: Payer: Self-pay | Admitting: Pediatrics

## 2016-11-16 DIAGNOSIS — L03031 Cellulitis of right toe: Secondary | ICD-10-CM

## 2016-11-16 DIAGNOSIS — E1121 Type 2 diabetes mellitus with diabetic nephropathy: Secondary | ICD-10-CM

## 2016-11-16 DIAGNOSIS — I10 Essential (primary) hypertension: Secondary | ICD-10-CM

## 2016-11-16 LAB — CBC WITH DIFFERENTIAL/PLATELET
Basophils Absolute: 0 10*3/uL (ref 0.0–0.1)
Basophils Relative: 0 %
Eosinophils Absolute: 0.3 10*3/uL (ref 0.0–0.7)
Eosinophils Relative: 4 %
HEMATOCRIT: 32.8 % — AB (ref 36.0–46.0)
HEMOGLOBIN: 11 g/dL — AB (ref 12.0–15.0)
LYMPHS PCT: 23 %
Lymphs Abs: 1.8 10*3/uL (ref 0.7–4.0)
MCH: 26.8 pg (ref 26.0–34.0)
MCHC: 33.5 g/dL (ref 30.0–36.0)
MCV: 80 fL (ref 78.0–100.0)
MONOS PCT: 8 %
Monocytes Absolute: 0.6 10*3/uL (ref 0.1–1.0)
NEUTROS PCT: 65 %
Neutro Abs: 4.9 10*3/uL (ref 1.7–7.7)
Platelets: 386 10*3/uL (ref 150–400)
RBC: 4.1 MIL/uL (ref 3.87–5.11)
RDW: 15 % (ref 11.5–15.5)
WBC: 7.6 10*3/uL (ref 4.0–10.5)

## 2016-11-16 LAB — GLUCOSE, CAPILLARY
GLUCOSE-CAPILLARY: 115 mg/dL — AB (ref 65–99)
GLUCOSE-CAPILLARY: 181 mg/dL — AB (ref 65–99)
GLUCOSE-CAPILLARY: 135 mg/dL — AB (ref 65–99)
Glucose-Capillary: 282 mg/dL — ABNORMAL HIGH (ref 65–99)

## 2016-11-16 LAB — BASIC METABOLIC PANEL
Anion gap: 6 (ref 5–15)
BUN: 19 mg/dL (ref 6–20)
CHLORIDE: 101 mmol/L (ref 101–111)
CO2: 26 mmol/L (ref 22–32)
CREATININE: 0.85 mg/dL (ref 0.44–1.00)
Calcium: 8.4 mg/dL — ABNORMAL LOW (ref 8.9–10.3)
GFR calc Af Amer: >60 mL/min (ref >60)
GFR calc non Af Amer: >60 mL/min (ref >60)
Glucose, Bld: 142 mg/dL — ABNORMAL HIGH (ref 65–99)
Potassium: 4 mmol/L (ref 3.5–5.1)
Sodium: 133 mmol/L — ABNORMAL LOW (ref 135–145)

## 2016-11-16 LAB — VANCOMYCIN, TROUGH: Vancomycin Tr: 20 ug/mL (ref 15–20)

## 2016-11-16 MED ORDER — VANCOMYCIN HCL IN DEXTROSE 750-5 MG/150ML-% IV SOLN
750.0000 mg | Freq: Two times a day (BID) | INTRAVENOUS | Status: DC
  Administered 2016-11-16 – 2016-11-19 (×6): 750 mg via INTRAVENOUS
  Filled 2016-11-16 (×8): qty 150

## 2016-11-16 MED ORDER — INSULIN ASPART 100 UNIT/ML ~~LOC~~ SOLN
4.0000 [IU] | Freq: Three times a day (TID) | SUBCUTANEOUS | Status: DC
  Administered 2016-11-16 (×3): 4 [IU] via SUBCUTANEOUS

## 2016-11-16 NOTE — Progress Notes (Signed)
Pharmacy Antibiotic Note  Pamela Fry is a 56 y.o. female admitted on 11/12/2016 with cellulitis.  Pharmacy has been consulted for Vancomycin  And Zosyn dosing.   Vanc trough this am 20  Plan: Change vancomycin to 750 mg IV q12 hours  Zosyn 3.375gm IV q8h , EID over 4 hours F/U clinical progress and cultures Monitor V/S, labs, and levels as indicated  Height: 5' (152.4 cm) Weight: 216 lb (98 kg) IBW/kg (Calculated) : 45.5  Temp (24hrs), Avg:98.4 F (36.9 C), Min:97.8 F (36.6 C), Max:98.9 F (37.2 C)   Recent Labs Lab 11/12/16 1742 11/12/16 2048 11/13/16 0450 11/14/16 0504 11/16/16 0540 11/16/16 0753  WBC 14.3*  --  10.4 9.8 7.6  --   CREATININE 1.17*  --  0.96 0.79 0.85  --   LATICACIDVEN  --  2.16*  --   --   --   --   VANCOTROUGH  --   --   --   --   --  20    Estimated Creatinine Clearance: 76.7 mL/min (by C-G formula based on SCr of 0.85 mg/dL).    No Known Allergies  Antimicrobials this admission: Vancomycin 1/30 >>  Zosyn 2/1>>   Dose adjustments this admission: 2/2  vanc changed to 750 q12  Microbiology results: 1/30 BCx: ngtd  Thank you for allowing pharmacy to be a part of this patient's care.  Talbert CageLora Arnett Duddy, PharmD Clinical Pharmacist 11/16/2016 9:52 AM

## 2016-11-16 NOTE — Progress Notes (Signed)
PROGRESS NOTE    Pamela Fry  UJW:[id number] DOB: 08/26/1961 DOA: 11/12/2016 PCP: Pcp Not In System   Brief Narrative:  56 yo female with T2DM, HTN, presents with right foot erythema and edema. Worsening symptoms for the last 2 days before hospitalization. Failed out patient antibiotics po. On the initial examination found significant erythema and edema at the right lower extremity. Admitted with the working diagnosis of cellulitis, started on broad spectrum antibiotic IV, CT with no abscess formation.   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 Right foot cellulitis. Extensive cellulitis of the right lower extremity. Will continue IV vancomycin, Zosyn, will continue to follow white cell count and temperature curve. Consulted wound care team and physical therapy. Pain control with oxycodone. CT with no deep abscess.   2. T2DM. Will continue glucose cover and monitoring with iss, capillary glucose 152 and 249. Patient tolerating po well. Continue base insulin with 12 units of lantus  Added prandial coverage 2/2.    CBG (last 3)   Recent Labs  11/15/16 1629 11/15/16 2105 11/16/16 0816  GLUCAP 262* 222* 115*   3. HTN. Continue blood pressure control with metoprolol and amlodipine, as needed hydralazine, blood pressure systolic 120 to 140. Continue asa.   4. AKI. Improved renal function with cr down to 0.79 with k at 4,0 and serum bicarb at 25, no IV fluids, follow on vancomycin levels per pharmacy protocol.   5. Dyslipidemia. Continue statin therapy.    DVT prophylaxis: enoxaparin  Code Status: full  Family Communication: No family at the bedside Disposition Plan: Home   Consultants:   WOC  Procedures:    Antimicrobials:   Vancomycin   Zosyn   Subjective: Patient feeling better, edema and erythema per patient are improving. No nausea or vomiting.  No dyspnea or chest pain.   Objective: Vitals:   11/15/16 1350 11/15/16 2231 11/15/16 2236 11/16/16 0712  BP: 131/61 (!) 145/79 (!) 150/67 118/63  Pulse: 82 86 82 86  Resp: 18  18 18   Temp: 98.9 F (37.2 C)  97.8 F (36.6 C) 98.6 F (37 C)  TempSrc: Oral  Oral Oral  SpO2: 94% 94% 95% 95%  Weight:      Height:        Intake/Output Summary (Last 24 hours) at 11/16/16 0952 Last data filed at 11/16/16 0843  Gross per 24 hour  Intake             1000 ml  Output              601 ml  Net              399 ml   Filed Weights   11/12/16 1518 11/13/16 0757 11/14/16 [id number]  Weight: 94.3 kg (208 lb) 98.7 kg (217 lb 9.6 oz) 98 kg (216 lb)    Examination:  General exam: Not in pain or dyspnea E ENT: no pallor or icterus, oral mucosa moist.    Respiratory system: Clear to auscultation. Respiratory effort normal. No wheezing rales or rhonchi.  Cardiovascular system: S1 & S2 heard, RRR. No JVD, murmurs, rubs, gallops or clicks. Right foot edema. Gastrointestinal system: Abdomen is nondistended, soft and nontender. No organomegaly or masses felt. Normal bowel sounds heard. Central nervous system: Alert and oriented. No focal neurological deficits. Extremities: Symmetric 5 x 5 power. Skin:  Right foot with edema and erythema, ulcerated lesion about 4 to 5 cm diameter, unable to stage present on the anterior foot. Proximal erythema below markings.        Data Reviewed: I have personally reviewed following labs and imaging studies  CBC:  Recent Labs Lab 11/12/16 1742 11/13/16 0450 11/14/16 0504 11/16/16 0540  WBC 14.3* 10.4 9.8 7.6  NEUTROABS 11.2*  --   --  4.9  HGB 11.9* 11.7* 10.7* 11.0*  HCT 35.5* 35.0* 32.5* 32.8*  MCV 79.4 78.5 80.4 80.0  PLT 482* 411* 446* 386   Basic Metabolic Panel:  Recent Labs Lab 11/12/16 1742 11/13/16 0450 11/14/16 0504 11/16/16 0540  NA 129* 133* 136 133*  K 3.8 4.0 4.0 4.0  CL 91* 99* 102 101  CO2 23 23 25 26   GLUCOSE 308* 197* 160*  142*  BUN 29* 28* 19 19  CREATININE 1.17* 0.96 0.79 0.85  CALCIUM 8.9 8.2* 8.2* 8.4*   GFR: Estimated Creatinine Clearance: 76.7 mL/min (by C-G formula based on SCr of 0.85 mg/dL). Liver Function Tests:  Recent Labs Lab 11/12/16 1742  AST 14*  ALT 17  ALKPHOS 87  BILITOT 1.0  PROT 9.8*  ALBUMIN 3.3*   No results for input(s): LIPASE, AMYLASE in the last 168 hours. No results for input(s): AMMONIA in the last 168 hours. Coagulation Profile: No results for input(s): INR, PROTIME in the last 168 hours. Cardiac Enzymes: No results for input(s): CKTOTAL, CKMB, CKMBINDEX, TROPONINI in the last 168 hours. BNP (last 3 results) No results for input(s): PROBNP in the last 8760 hours. HbA1C: No results for input(s): HGBA1C in the last 72 hours. CBG:  Recent Labs Lab 11/15/16 0809 11/15/16 1113 11/15/16 1629 11/15/16 2105 11/16/16 0816  GLUCAP 153* 249* 262* 222* 115*   Lipid Profile: No results for input(s): CHOL, HDL, LDLCALC, TRIG, CHOLHDL, LDLDIRECT in the last 72 hours. Thyroid Function Tests: No results for input(s): TSH, T4TOTAL, FREET4, T3FREE, THYROIDAB in the last 72 hours. Anemia Panel: No results for input(s): VITAMINB12, FOLATE, FERRITIN, TIBC, IRON, RETICCTPCT in the last 72 hours. Sepsis Labs:  Recent Labs Lab 11/12/16 2048  LATICACIDVEN 2.16*    Recent Results (from the past 240 hour(s))  Blood culture (routine x 2)     Status: None (Preliminary result)   Collection Time: 11/12/16  7:42 PM  Result Value Ref Range Status   Specimen Description LEFT ANTECUBITAL  Final   Special Requests BOTTLES DRAWN AEROBIC AND ANAEROBIC 6CC EACH  Final   Culture NO GROWTH 4 DAYS  Final   Report Status PENDING  Incomplete  Blood culture (routine x 2)     Status: None (Preliminary result)   Collection Time: 11/12/16  8:23 PM  Result Value Ref Range Status   Specimen Description BLOOD RIGHT FOREARM  Final   Special Requests BOTTLES DRAWN AEROBIC AND ANAEROBIC 6CC   Final   Culture NO GROWTH 4 DAYS  Final   Report Status PENDING  Incomplete    Radiology Studies: No results found.  Scheduled Meds: . amLODipine  10 mg Oral Daily  . aspirin EC  81 mg Oral Daily  . atorvastatin  10 mg Oral Daily  . enoxaparin (LOVENOX) injection  40 mg Subcutaneous Q24H  . insulin aspart  0-5 Units Subcutaneous QHS  . insulin aspart  0-9 Units Subcutaneous TID WC  . insulin aspart  4 Units Subcutaneous TID WC  . insulin glargine  12 Units Subcutaneous Daily  . metoprolol  100 mg  Oral BID  . piperacillin-tazobactam (ZOSYN)  IV  3.375 g Intravenous Q8H  . vancomycin  750 mg Intravenous Q12H   Continuous Infusions:   LOS: 4 days   Standley Dakins, MD [facility] Pager [phone]  If 7PM-7AM, please contact night-coverage [email or web address] Password [facility] 11/16/2016, 9:52 AM

## 2016-11-17 LAB — GLUCOSE, CAPILLARY
GLUCOSE-CAPILLARY: 260 mg/dL — AB (ref 65–99)
GLUCOSE-CAPILLARY: 222 mg/dL — AB (ref 65–99)
Glucose-Capillary: 159 mg/dL — ABNORMAL HIGH (ref 65–99)
Glucose-Capillary: 216 mg/dL — ABNORMAL HIGH (ref 65–99)

## 2016-11-17 LAB — CULTURE, BLOOD (ROUTINE X 2)
CULTURE: NO GROWTH
CULTURE: NO GROWTH

## 2016-11-17 MED ORDER — INSULIN ASPART 100 UNIT/ML ~~LOC~~ SOLN
6.0000 [IU] | Freq: Three times a day (TID) | SUBCUTANEOUS | Status: DC
  Administered 2016-11-17 – 2016-11-18 (×4): 6 [IU] via SUBCUTANEOUS

## 2016-11-17 NOTE — Progress Notes (Signed)
PROGRESS NOTE    HOLIDAY MCMENAMIN  WJX:[id number] DOB: [birth year] DOA: 11/12/2016 PCP: Pcp Not In System   Brief Narrative:  56 yo female with T2DM, HTN, presents with right foot erythema and edema. Worsening symptoms for the last 2 days before hospitalization. Failed out patient antibiotics po. On the initial examination found significant erythema and edema at the right lower extremity. Admitted with the working diagnosis of cellulitis, started on broad spectrum antibiotic IV, CT with no abscess formation.   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 Right foot cellulitis. Extensive cellulitis of the right lower extremity. Will continue IV vancomycin, Zosyn, will continue to follow white cell count and temperature curve. Consulted wound care team and physical therapy. Wound is slowly improving but still with significant swelling and tenderness in distal foot.  Pain control with oxycodone. CT with no deep abscess.   2. T2DM. Will continue glucose cover and monitoring.  Patient tolerating po well. Continue base insulin with 12 units of lantus  Added prandial coverage 2/2 with good results.    CBG (last 3)   Recent Labs  11/16/16 1637 11/16/16 2151 11/17/16 0735  GLUCAP 135* 282* 159*   3. HTN. Continue blood pressure control with metoprolol and amlodipine, as needed hydralazine, blood pressure systolic 120 to 140. Continue asa.   4. AKI. Improved renal function with cr down to 0.79 with k at 4,0 and serum bicarb at 25, no IV fluids, follow on vancomycin levels per pharmacy protocol.   5. Dyslipidemia. Continue statin therapy.    DVT prophylaxis: enoxaparin  Code Status: full  Family Communication: No family at the bedside Disposition Plan: Home   Consultants:   WOC  Procedures:    Antimicrobials:   Vancomycin   Zosyn   Subjective: Patient still has  pain and swelling in foot, overall noticing improvement.   Objective: Vitals:   11/16/16 0712 11/16/16 1521 11/16/16 2305 11/17/16 0500  BP: 118/63 (!) 150/66 (!) 141/77 135/62  Pulse: 86 81 80 71  Resp: 18 20 19 18   Temp: 98.6 F (37 C) 98.2 F (36.8 C) 98.8 F (37.1 C) 98 F (36.7 C)  TempSrc: Oral Oral Oral Oral  SpO2: 95% 96% 97% 97%  Weight:    99.3 kg (218 lb 14.7 oz)  Height:        Intake/Output Summary (Last 24 hours) at 11/17/16 0936 Last data filed at 11/17/16 0556  Gross per 24 hour  Intake             1200 ml  Output             2425 ml  Net            -1225 ml   Filed Weights   11/13/16 0757 11/14/16 0632 11/17/16 0500  Weight: 98.7 kg (217 lb 9.6 oz) 98 kg (216 lb) 99.3 kg (218 lb 14.7 oz)    Examination:  General exam: Not in pain or dyspnea E ENT: no pallor or icterus, oral mucosa moist.    Respiratory system: Clear to auscultation. Respiratory effort normal. No wheezing rales or rhonchi.  Cardiovascular system: S1 & S2 heard, RRR. No JVD, murmurs, rubs, gallops or clicks. Right foot edema. Gastrointestinal system: Abdomen is nondistended, soft and nontender. No organomegaly or masses felt. Normal bowel sounds heard. Central nervous system: Alert and oriented.  No focal neurological deficits. Extremities: Symmetric 5 x 5 power. Skin: Right foot with edema and erythema, ulcerated lesion about 4 to 5 cm diameter, unable to stage present on the anterior foot. Proximal erythema below markings.      Data Reviewed: I have personally reviewed following labs and imaging studies  CBC:  Recent Labs Lab 11/12/16 1742 11/13/16 0450 11/14/16 0504 11/16/16 0540  WBC 14.3* 10.4 9.8 7.6  NEUTROABS 11.2*  --   --  4.9  HGB 11.9* 11.7* 10.7* 11.0*  HCT 35.5* 35.0* 32.5* 32.8*  MCV 79.4 78.5 80.4 80.0  PLT 482* 411* 446* 386   Basic Metabolic Panel:  Recent Labs Lab 11/12/16 1742 11/13/16 0450 11/14/16 0504 11/16/16 0540  NA 129* 133* 136 133*  K  3.8 4.0 4.0 4.0  CL 91* 99* 102 101  CO2 23 23 25 26   GLUCOSE 308* 197* 160* 142*  BUN 29* 28* 19 19  CREATININE 1.17* 0.96 0.79 0.85  CALCIUM 8.9 8.2* 8.2* 8.4*   GFR: Estimated Creatinine Clearance: 77.2 mL/min (by C-G formula based on SCr of 0.85 mg/dL). Liver Function Tests:  Recent Labs Lab 11/12/16 1742  AST 14*  ALT 17  ALKPHOS 87  BILITOT 1.0  PROT 9.8*  ALBUMIN 3.3*   No results for input(s): LIPASE, AMYLASE in the last 168 hours. No results for input(s): AMMONIA in the last 168 hours. Coagulation Profile: No results for input(s): INR, PROTIME in the last 168 hours. Cardiac Enzymes: No results for input(s): CKTOTAL, CKMB, CKMBINDEX, TROPONINI in the last 168 hours. BNP (last 3 results) No results for input(s): PROBNP in the last 8760 hours. HbA1C: No results for input(s): HGBA1C in the last 72 hours. CBG:  Recent Labs Lab 11/16/16 0816 11/16/16 1122 11/16/16 1637 11/16/16 2151 11/17/16 0735  GLUCAP 115* 181* 135* 282* 159*   Lipid Profile: No results for input(s): CHOL, HDL, LDLCALC, TRIG, CHOLHDL, LDLDIRECT in the last 72 hours. Thyroid Function Tests: No results for input(s): TSH, T4TOTAL, FREET4, T3FREE, THYROIDAB in the last 72 hours. Anemia Panel: No results for input(s): VITAMINB12, FOLATE, FERRITIN, TIBC, IRON, RETICCTPCT in the last 72 hours. Sepsis Labs:  Recent Labs Lab 11/12/16 2048  LATICACIDVEN 2.16*    Recent Results (from the past 240 hour(s))  Blood culture (routine x 2)     Status: None (Preliminary result)   Collection Time: 11/12/16  7:42 PM  Result Value Ref Range Status   Specimen Description LEFT ANTECUBITAL  Final   Special Requests BOTTLES DRAWN AEROBIC AND ANAEROBIC 6CC EACH  Final   Culture NO GROWTH 4 DAYS  Final   Report Status PENDING  Incomplete  Blood culture (routine x 2)     Status: None (Preliminary result)   Collection Time: 11/12/16  8:23 PM  Result Value Ref Range Status   Specimen Description BLOOD  RIGHT FOREARM  Final   Special Requests BOTTLES DRAWN AEROBIC AND ANAEROBIC 6CC  Final   Culture NO GROWTH 4 DAYS  Final   Report Status PENDING  Incomplete    Radiology Studies: No results found.  Scheduled Meds: . amLODipine  10 mg Oral Daily  . aspirin EC  81 mg Oral Daily  . atorvastatin  10 mg Oral Daily  . enoxaparin (LOVENOX) injection  40 mg Subcutaneous Q24H  . insulin aspart  0-5 Units Subcutaneous QHS  . insulin aspart  0-9 Units Subcutaneous TID WC  . insulin aspart  4 Units Subcutaneous TID WC  . insulin glargine  12  Units Subcutaneous Daily  . metoprolol  100 mg Oral BID  . piperacillin-tazobactam (ZOSYN)  IV  3.375 g Intravenous Q8H  . vancomycin  750 mg Intravenous Q12H   Continuous Infusions:   LOS: 5 days   Standley Dakins, MD [facility] Pager [phone]  If 7PM-7AM, please contact night-coverage [email or web address] Password [facility] 11/17/2016, 9:36 AM

## 2016-11-18 LAB — COMPREHENSIVE METABOLIC PANEL
ALK PHOS: 71 U/L (ref 38–126)
ALT: 36 U/L (ref 14–54)
ANION GAP: 6 (ref 5–15)
AST: 34 U/L (ref 15–41)
Albumin: 2.7 g/dL — ABNORMAL LOW (ref 3.5–5.0)
BUN: 15 mg/dL (ref 6–20)
CALCIUM: 8.5 mg/dL — AB (ref 8.9–10.3)
CO2: 26 mmol/L (ref 22–32)
Chloride: 103 mmol/L (ref 101–111)
Creatinine, Ser: 0.86 mg/dL (ref 0.44–1.00)
GFR calc non Af Amer: >60 mL/min (ref >60)
Glucose, Bld: 146 mg/dL — ABNORMAL HIGH (ref 65–99)
POTASSIUM: 4.3 mmol/L (ref 3.5–5.1)
Sodium: 135 mmol/L (ref 135–145)
TOTAL PROTEIN: 7.9 g/dL (ref 6.5–8.1)
Total Bilirubin: 0.4 mg/dL (ref 0.3–1.2)

## 2016-11-18 LAB — GLUCOSE, CAPILLARY
GLUCOSE-CAPILLARY: 144 mg/dL — AB (ref 65–99)
GLUCOSE-CAPILLARY: 140 mg/dL — AB (ref 65–99)
GLUCOSE-CAPILLARY: 185 mg/dL — AB (ref 65–99)
Glucose-Capillary: 160 mg/dL — ABNORMAL HIGH (ref 65–99)
Glucose-Capillary: 143 mg/dL — ABNORMAL HIGH (ref 65–99)
Glucose-Capillary: 236 mg/dL — ABNORMAL HIGH (ref 65–99)

## 2016-11-18 LAB — CBC WITH DIFFERENTIAL/PLATELET
BASOS PCT: 0 %
Basophils Absolute: 0 10*3/uL (ref 0.0–0.1)
EOS PCT: 6 %
Eosinophils Absolute: 0.4 10*3/uL (ref 0.0–0.7)
HCT: 33.1 % — ABNORMAL LOW (ref 36.0–46.0)
HEMOGLOBIN: 10.9 g/dL — AB (ref 12.0–15.0)
LYMPHS ABS: 1.9 10*3/uL (ref 0.7–4.0)
Lymphocytes Relative: 25 %
MCH: 26.7 pg (ref 26.0–34.0)
MCHC: 32.9 g/dL (ref 30.0–36.0)
MCV: 80.9 fL (ref 78.0–100.0)
MONO ABS: 0.6 10*3/uL (ref 0.1–1.0)
MONOS PCT: 8 %
Neutro Abs: 4.6 10*3/uL (ref 1.7–7.7)
Neutrophils Relative %: 61 %
Platelets: 410 10*3/uL — ABNORMAL HIGH (ref 150–400)
RBC: 4.09 MIL/uL (ref 3.87–5.11)
RDW: 15.5 % (ref 11.5–15.5)
WBC: 7.4 10*3/uL (ref 4.0–10.5)

## 2016-11-18 NOTE — Progress Notes (Signed)
PROGRESS NOTE    Pamela Fry  RUE:[id number] DOB: Feb 25, 1961 DOA: 11/12/2016 PCP: Pcp Not In System   Brief Narrative:  56 yo female with T2DM, HTN, presents with right foot erythema and edema. Worsening symptoms for the last 2 days before hospitalization. Failed out patient antibiotics po. On the initial examination found significant erythema and edema at the right lower extremity. Admitted with the working diagnosis of cellulitis, started on broad spectrum antibiotic IV, CT with no abscess formation.   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 Right foot cellulitis. Extensive cellulitis of the right lower extremity. Will continue IV vancomycin, Zosyn, will continue to follow white cell count and temperature curve. Consulted wound care team and physical therapy. Wound is slowly improving but still with significant swelling and tenderness in distal foot.  I thing she still needs IV antibiotics for the next 24 hours. We will reassess tomorrow. Possible transition to oral antibiotics 2/5. Pain control with oxycodone. CT with no deep abscess.   2. T2DM. Will continue glucose cover and monitoring.  Patient tolerating po well. Continue base insulin with 12 units of lantus  Added prandial coverage 2/2 with good results.    CBG (last 3)   Recent Labs  11/18/16 0234 11/18/16 0441 11/18/16 0734  GLUCAP 160* 144* 140*   3. HTN. Continue blood pressure control with metoprolol and amlodipine, as needed hydralazine, blood pressure systolic 120 to 140. Continue asa.   4. AKI. Improved renal function with cr down to 0.79 with k at 4,0 and serum bicarb at 25, no IV fluids, follow on vancomycin levels per pharmacy protocol.   5. Dyslipidemia. Continue statin therapy.    DVT prophylaxis: enoxaparin  Code Status: full  Family Communication: No family at the bedside Disposition  Plan: Home   Consultants:   WOC  Procedures:    Antimicrobials:   Vancomycin   Zosyn   Subjective: Patient still has pain and swelling in foot, overall noticing improvement.   Objective: Vitals:   11/17/16 1501 11/17/16 1958 11/18/16 0444 11/18/16 0616  BP: (!) 114/47 (!) 142/65 132/65   Pulse: 76 76 70   Resp: 20 20 20    Temp: 98 F (36.7 C) 97.7 F (36.5 C) 98 F (36.7 C)   TempSrc: Oral Oral Oral   SpO2: 95% 95% 94%   Weight:    101 kg (222 lb 9.6 oz)  Height:        Intake/Output Summary (Last 24 hours) at 11/18/16 1052 Last data filed at 11/18/16 1478  Gross per 24 hour  Intake              890 ml  Output             1450 ml  Net             -560 ml   Filed Weights   11/14/16 0632 11/17/16 0500 11/18/16 0616  Weight: 98 kg (216 lb) 99.3 kg (218 lb 14.7 oz) 101 kg (222 lb 9.6 oz)    Examination:  General exam: Not in pain or dyspnea E ENT: no pallor or icterus, oral mucosa moist.    Respiratory system: Clear to auscultation. Respiratory effort normal. No wheezing rales or rhonchi.  Cardiovascular system: S1 & S2 heard, RRR. No JVD, murmurs, rubs, gallops or clicks. Right foot edema. Gastrointestinal system:  Abdomen is no  ndistended, soft and nontender. No organomegaly or masses felt. Normal bowel sounds heard. Central nervous system: Alert and oriented. No focal neurological deficits. Extremities: Symmetric 5 x 5 power. Skin: Right foot with edema and erythema, ulcerated lesion about 4 to 5 cm diameter, unable to stage present on the anterior foot. Proximal erythema below markings.         Data Reviewed: I have personally reviewed following labs and imaging studies  CBC:  Recent Labs Lab 11/12/16 1742 11/13/16 0450 11/14/16 0504 11/16/16 0540 11/18/16 0607  WBC 14.3* 10.4 9.8 7.6 7.4  NEUTROABS 11.2*  --   --  4.9 4.6  HGB 11.9* 11.7* 10.7* 11.0* 10.9*  HCT 35.5* 35.0* 32.5* 32.8* 33.1*  MCV 79.4 78.5 80.4 80.0 80.9  PLT 482* 411*  446* 386 410*   Basic Metabolic Panel:  Recent Labs Lab 11/12/16 1742 11/13/16 0450 11/14/16 0504 11/16/16 0540 11/18/16 0607  NA 129* 133* 136 133* 135  K 3.8 4.0 4.0 4.0 4.3  CL 91* 99* 102 101 103  CO2 23 23 25 26 26   GLUCOSE 308* 197* 160* 142* 146*  BUN 29* 28* 19 19 15   CREATININE 1.17* 0.96 0.79 0.85 0.86  CALCIUM 8.9 8.2* 8.2* 8.4* 8.5*   GFR: Estimated Creatinine Clearance: 77.1 mL/min (by C-G formula based on SCr of 0.86 mg/dL). Liver Function Tests:  Recent Labs Lab 11/12/16 1742 11/18/16 0607  AST 14* 34  ALT 17 36  ALKPHOS 87 71  BILITOT 1.0 0.4  PROT 9.8* 7.9  ALBUMIN 3.3* 2.7*   No results for input(s): LIPASE, AMYLASE in the last 168 hours. No results for input(s): AMMONIA in the last 168 hours. Coagulation Profile: No results for input(s): INR, PROTIME in the last 168 hours. Cardiac Enzymes: No results for input(s): CKTOTAL, CKMB, CKMBINDEX, TROPONINI in the last 168 hours. BNP (last 3 results) No results for input(s): PROBNP in the last 8760 hours. HbA1C: No results for input(s): HGBA1C in the last 72 hours. CBG:  Recent Labs Lab 11/17/16 1626 11/17/16 2001 11/18/16 0234 11/18/16 0441 11/18/16 0734  GLUCAP 260* 222* 160* 144* 140*   Lipid Profile: No results for input(s): CHOL, HDL, LDLCALC, TRIG, CHOLHDL, LDLDIRECT in the last 72 hours. Thyroid Function Tests: No results for input(s): TSH, T4TOTAL, FREET4, T3FREE, THYROIDAB in the last 72 hours. Anemia Panel: No results for input(s): VITAMINB12, FOLATE, FERRITIN, TIBC, IRON, RETICCTPCT in the last 72 hours. Sepsis Labs:  Recent Labs Lab 11/12/16 2048  LATICACIDVEN 2.16*    Recent Results (from the past 240 hour(s))  Blood culture (routine x 2)     Status: None   Collection Time: 11/12/16  7:42 PM  Result Value Ref Range Status   Specimen Description LEFT ANTECUBITAL  Final   Special Requests BOTTLES DRAWN AEROBIC AND ANAEROBIC [facility] EACH  Final   Culture NO GROWTH 5 DAYS   Final   Report Status 11/17/2016 FINAL  Final  Blood culture (routine x 2)     Status: None   Collection Time: 11/12/16  8:23 PM  Result Value Ref Range Status   Specimen Description BLOOD RIGHT FOREARM  Final   Special Requests BOTTLES DRAWN AEROBIC AND ANAEROBIC 6CC  Final   Culture NO GROWTH 5 DAYS  Final   Report Status 11/17/2016 FINAL  Final    Radiology Studies: No results found.  Scheduled Meds: . amLODipine  10 mg Oral Daily  . aspirin EC  81 mg Oral Daily  . atorvastatin  10 mg Oral Daily  . enoxaparin (LOVENOX) injection  40 mg Subcutaneous Q24H  . insulin aspart  0-5 Units Subcutaneous QHS  . insulin aspart  0-9 Units Subcutaneous TID WC  . insulin aspart  6 Units Subcutaneous TID WC  . insulin glargine  12 Units Subcutaneous Daily  . metoprolol  100 mg Oral BID  . piperacillin-tazobactam (ZOSYN)  IV  3.375 g Intravenous Q8H  . vancomycin  750 mg Intravenous Q12H   Continuous Infusions:   LOS: 6 days   Standley Dakinslanford Valina Maes, MD [facility] Pager [phone]  If 7PM-7AM, please contact night-coverage [email or web address] Password [email or web address] 11/18/2016, 10:52 AM

## 2016-11-19 LAB — CREATININE, SERUM
Creatinine, Ser: 0.89 mg/dL (ref 0.44–1.00)
GFR calc Af Amer: >60 mL/min (ref >60)
GFR calc non Af Amer: >60 mL/min (ref >60)

## 2016-11-19 LAB — GLUCOSE, CAPILLARY
GLUCOSE-CAPILLARY: 150 mg/dL — AB (ref 65–99)
Glucose-Capillary: 147 mg/dL — ABNORMAL HIGH (ref 65–99)

## 2016-11-19 MED ORDER — AMOXICILLIN-POT CLAVULANATE 875-125 MG PO TABS: 1.0000 | ORAL_TABLET | Freq: Two times a day (BID) | ORAL | 0 refills | Status: AC

## 2016-11-19 NOTE — Discharge Summary (Addendum)
Physician Discharge Summary  Pamela Fry GNF:[id number] DOB: [birth year] DOA: 11/12/2016  PCP: Pcp Not In System  Admit date: 11/12/2016 Discharge date: 11/19/2016  Admitted From: Home  Disposition:  Home with [facility] services  Recommendations for Outpatient Follow-up:  1. Follow up with PCP in 1-2 weeks 2. Please obtain BMP/CBC in one week 3. Please follow up on the following pending results:  Home Health: YES  Equipment/Devices: DME rolling walker  Discharge Condition: STABLE  CODE STATUS: FULL  Diet recommendation: Heart Healthy / Carb Modified   Brief/Interim Summary: HPI: Pamela Fry is a 56 y.o. female with medical history significant of DM, HTN comes in with 2 days of progressive worsening swelling and redness to her right forefoot.  She was started on abx yesterday by outpatient doctor but it has gotten worse.  Has not been on abx prior to yesterday.  No recent injuries to the foot.  Since it started swelling and getting red it has developed a superficial wound and crack that was not there before 2 days ago.  She is referred for treatment of cellulitis.  1. Right foot cellulitis. Extensive cellulitis of the right lower extremity. Will continue IV vancomycin, Zosyn, will continue to follow white cell count and temperature curve. Consulted wound care team and physical therapy. Wound is slowly improving but still with significant swelling and tenderness in distal foot.   She has clinically much improved on IV antibiotics and wants to go home.  Says she will follow up with her PCP who has been helping her manage the infection.  Discharge home on oral augmentin.  Close follow up and home health has been arranged for patient and DME has been arranged.  CT with no deep abscess seen.   2. T2DM. Will continue glucose cover and monitoring.  Patient tolerating po well. Continue base insulin with 12 units of lantus  Added prandial coverage 2/2 with good results.    CBG (last 3)   Recent Labs (last  2 labs)    Recent Labs  11/18/16 0234 11/18/16 0441 11/18/16 0734  GLUCAP 160* 144* 140*     3. HTN. Continue blood pressure control with metoprolol and amlodipine, as needed hydralazine, blood pressure systolic 120 to 140. Continue asa.   4. AKI. Improved renal function with cr down to 0.79 with k at 4,0 and serum bicarb at 25, no IV fluids, follow on vancomycin levels per pharmacy protocol.   5. Dyslipidemia. Continue statin therapy.    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   amoxicillin-clavulanate 875-125 MG tablet Commonly known as:  AUGMENTIN  Take 1 tablet by mouth 2 (two) times daily.   aspirin EC 81 MG tablet Take 81 mg by mouth daily.   atorvastatin 10 MG tablet Commonly known as:  LIPITOR Take 1 tablet by mouth daily.   calcium-vitamin D 500-200 MG-UNIT tablet Commonly known as:  OSCAL WITH D Take 1 tablet by mouth 2 (two) times daily.   Cinnamon 500 MG capsule Take 1,000 mg by mouth daily.   glipiZIDE 10 MG 24 hr tablet Commonly known as:  GLUCOTROL XL Take 1 tablet by mouth daily.   hydrochlorothiazide 25 MG tablet Commonly known as:  HYDRODIURIL Take 1  tablet by mouth daily.   INVOKAMET XR [id number] MG Tb24 Generic drug:  Canagliflozin-Metformin HCl ER Take 1 tablet by mouth 2 (two) times daily.   lisinopril 40 MG tablet Commonly known as:  PRINIVIL,ZESTRIL Take 1 tablet by mouth daily.   metoprolol 100 MG tablet Commonly known as:  LOPRESSOR Take 1 tablet by mouth 2 (two) times daily.   VICTOZA 18 MG/3ML Sopn Generic drug:  liraglutide Inject 1.8 mg into the skin daily.            Durable Medical Equipment        Start     Ordered   11/16/16 1540  For home use only DME Walker rolling  Once    Question:  Patient needs a walker to treat with the following condition  Answer:  Weakness   11/16/16 1540     Follow-up Information    Primary care provider. Schedule an appointment as soon as possible for a visit in 1 week(s).          No Known Allergies  Procedures/Studies: Dg Ankle Complete Right  Result Date: 11/09/2016 CLINICAL DATA:  Injury. EXAM: RIGHT ANKLE - COMPLETE 3+ VIEW COMPARISON:  No prior. FINDINGS: Diffuse soft tissue swelling. No acute bony or joint abnormality. Peripheral vascular calcification. IMPRESSION: 1.  Diffuse degenerative change.  No acute bony abnormality. 2.  Peripheral vascular disease . Electronically Signed   By: Maisie Fushomas  Register   On: 11/09/2016 14:07   Ct Foot Right W Contrast  Result Date: 11/13/2016 CLINICAL DATA:  Right lower ankle pain and leg pain. Redness and drainage. EXAM: CT OF THE LOWER RIGHT EXTREMITY WITH CONTRAST TECHNIQUE: Multidetector CT imaging of the lower right extremity was performed according to the standard protocol following intravenous contrast administration. COMPARISON:  None. CONTRAST:  75mL ISOVUE-300 IOPAMIDOL (ISOVUE-300) INJECTION 61% FINDINGS: Bones/Joint/Cartilage No acute fracture or dislocation. Mild osteoarthritis of the talonavicular joint head navicular-medial cuneiform joint. Plantar calcaneal spur. Enthesopathic changes of the Achilles tendon  insertion. Mild osteoarthritis of the second, third, fourth tarsometatarsal joints. Ligaments Suboptimally assessed by CT. Muscles and Tendons The muscles are normal. Intact flexor, extensor, peroneal and Achilles tendons. Soft tissues Severe soft tissue swelling throughout the right ankle and foot concerning for cellulitis. No focal fluid collection to suggest an abscess. IMPRESSION: 1. Extensive cellulitis of the right ankle and foot. Electronically Signed   By: Elige KoHetal  Patel   On: 11/13/2016 15:34   Dg Foot Complete Right  Result Date: 11/12/2016 CLINICAL DATA:  Wound to top of foot, redness and swelling to the level of the mid tibia/fibula. Injury to right ankle and lower leg on 11/05/2016 with subsequent redness/drainage and increased swelling. EXAM: RIGHT FOOT COMPLETE - 3+ VIEW COMPARISON:  Plain film of the right foot and ankle dated 11/09/2016 FINDINGS: Osseous alignment remains normal. Overall bone mineralization is normal. No fracture line or  displaced fracture fragment identified. No acute or suspicious osseous lesion. No destructive change seen to suggest osteomyelitis. There is at least mild soft tissue thickening/edema over the dorsum of the midfoot. No soft tissue gas or air-fluid levels are identified. IMPRESSION: 1. Soft tissue swelling. 2. No acute or suspicious osseous finding. Electronically Signed   By: Bary Richard M.D.   On: 11/12/2016 20:27   Dg Foot Complete Right  Result Date: 11/09/2016 CLINICAL DATA:  Injury. EXAM: RIGHT FOOT COMPLETE - 3+ VIEW COMPARISON:  No recent . FINDINGS: Diffuse soft tissue swelling. No acute bony or joint abnormality identified. No evidence of fracture or dislocation. Diffuse degenerative change. IMPRESSION: Diffuse soft-tissue swelling and degenerative change. No acute abnormality . Electronically Signed   By: Maisie Fus  Register   On: 11/09/2016 13:58     Subjective: Pt reports that she is feeling better and really wants to go home.    Discharge  Exam: Vitals:   11/18/16 2135 11/19/16 0500  BP: 136/60 (!) 148/70  Pulse: 70 70  Resp: 18 20  Temp: 97.9 F (36.6 C) 97.7 F (36.5 C)   Vitals:   11/18/16 0616 11/18/16 1526 11/18/16 2135 11/19/16 0500  BP:  139/70 136/60 (!) 148/70  Pulse:  70 70 70  Resp:  20 18 20   Temp:  98.2 F (36.8 C) 97.9 F (36.6 C) 97.7 F (36.5 C)  TempSrc:  Oral Oral Oral  SpO2:  96% 95% 94%  Weight: 101 kg (222 lb 9.6 oz)   95.7 kg (211 lb)  Height:        General exam: Not in pain or dyspnea, awake, alert, NAD.  E ENT: no pallor or icterus, oral mucosa moist.    Respiratory system: Clear to auscultation. Respiratory effort normal. No wheezing rales or rhonchi.  Cardiovascular system: S1 & S2 heard, RRR. No JVD, murmurs, rubs, gallops or clicks.  Gastrointestinal system: Abdomen is no  ndistended, soft and nontender. No organomegaly or masses felt. Normal bowel sounds heard. Central nervous system: Alert and oriented. No focal neurological deficits. Extremities: Symmetric 5 x 5 power. Skin: Right foot with reduced edema and nearly completely resolved erythema, ulcerated lesion about 4 to 5 cm diameter healing. Overall marked improvement.    The results of significant diagnostics from this hospitalization (including imaging, microbiology, ancillary and laboratory) are listed below for reference.     Microbiology: Recent Results (from the past 240 hour(s))  Blood culture (routine x 2)     Status: None   Collection Time: 11/12/16  7:42 PM  Result Value Ref Range Status   Specimen Description LEFT ANTECUBITAL  Final   Special Requests BOTTLES DRAWN AEROBIC AND ANAEROBIC [facility] EACH  Final   Culture NO GROWTH 5 DAYS  Final   Report Status 11/17/2016 FINAL  Final  Blood culture (routine x 2)     Status: None   Collection Time: 11/12/16  8:23 PM  Result Value Ref Range Status   Specimen Description BLOOD RIGHT FOREARM  Final   Special Requests BOTTLES DRAWN AEROBIC AND ANAEROBIC 6CC  Final    Culture NO GROWTH 5 DAYS  Final   Report Status 11/17/2016 FINAL  Final     Labs: BNP (last 3 results)  Recent Labs  11/27/15 0925  BNP 270.0*   Basic Metabolic Panel:  Recent Labs Lab 11/12/16 1742 11/13/16 0450 11/14/16 0504 11/16/16 0540 11/18/16 0607 11/19/16 0616  NA 129* 133* 136 133* 135  --   K 3.8 4.0 4.0  4.0 4.3  --   CL 91* 99* 102 101 103  --   CO2 23 23 25 26 26   --   GLUCOSE 308* 197* 160* 142* 146*  --   BUN 29* 28* 19 19 15   --   CREATININE 1.17* 0.96 0.79 0.85 0.86 0.89  CALCIUM 8.9 8.2* 8.2* 8.4* 8.5*  --    Liver Function Tests:  Recent Labs Lab 11/12/16 1742 11/18/16 0607  AST 14* 34  ALT 17 36  ALKPHOS 87 71  BILITOT 1.0 0.4  PROT 9.8* 7.9  ALBUMIN 3.3* 2.7*   No results for input(s): LIPASE, AMYLASE in the last 168 hours. No results for input(s): AMMONIA in the last 168 hours. CBC:  Recent Labs Lab 11/12/16 1742 11/13/16 0450 11/14/16 0504 11/16/16 0540 11/18/16 0607  WBC 14.3* 10.4 9.8 7.6 7.4  NEUTROABS 11.2*  --   --  4.9 4.6  HGB 11.9* 11.7* 10.7* 11.0* 10.9*  HCT 35.5* 35.0* 32.5* 32.8* 33.1*  MCV 79.4 78.5 80.4 80.0 80.9  PLT 482* 411* 446* 386 410*   Cardiac Enzymes: No results for input(s): CKTOTAL, CKMB, CKMBINDEX, TROPONINI in the last 168 hours. BNP: Invalid input(s): POCBNP CBG:  Recent Labs Lab 11/18/16 1120 11/18/16 1629 11/18/16 2134 11/19/16 0305 11/19/16 0717  GLUCAP 185* 143* 236* 147* 150*   D-Dimer No results for input(s): DDIMER in the last 72 hours. Hgb A1c No results for input(s): HGBA1C in the last 72 hours. Lipid Profile No results for input(s): CHOL, HDL, LDLCALC, TRIG, CHOLHDL, LDLDIRECT in the last 72 hours. Thyroid function studies No results for input(s): TSH, T4TOTAL, T3FREE, THYROIDAB in the last 72 hours.  Invalid input(s): FREET3 Anemia work up No results for input(s): VITAMINB12, FOLATE, FERRITIN, TIBC, IRON, RETICCTPCT in the last 72 hours. Urinalysis    Component Value  Date/Time   COLORURINE YELLOW 11/27/2015 1119   APPEARANCEUR CLOUDY (A) 11/27/2015 1119   LABSPEC <1.005 (L) 11/27/2015 1119   PHURINE 6.0 11/27/2015 1119   GLUCOSEU >1000 (A) 11/27/2015 1119   HGBUR MODERATE (A) 11/27/2015 1119   BILIRUBINUR NEGATIVE 11/27/2015 1119   KETONESUR TRACE (A) 11/27/2015 1119   PROTEINUR 30 (A) 11/27/2015 1119   NITRITE NEGATIVE 11/27/2015 1119   LEUKOCYTESUR NEGATIVE 11/27/2015 1119   Sepsis Labs Invalid input(s): PROCALCITONIN,  WBC,  LACTICIDVEN Microbiology Recent Results (from the past 240 hour(s))  Blood culture (routine x 2)     Status: None   Collection Time: 11/12/16  7:42 PM  Result Value Ref Range Status   Specimen Description LEFT ANTECUBITAL  Final   Special Requests BOTTLES DRAWN AEROBIC AND ANAEROBIC [facility] EACH  Final   Culture NO GROWTH 5 DAYS  Final   Report Status 11/17/2016 FINAL  Final  Blood culture (routine x 2)     Status: None   Collection Time: 11/12/16  8:23 PM  Result Value Ref Range Status   Specimen Description BLOOD RIGHT FOREARM  Final   Special Requests BOTTLES DRAWN AEROBIC AND ANAEROBIC 6CC  Final   Culture NO GROWTH 5 DAYS  Final   Report Status 11/17/2016 FINAL  Final   Time coordinating discharge: 33 minutes  SIGNED:  Standley Dakins, MD  [facility] 11/19/2016, 10:03 AM Pager   If 7PM-7AM, please contact night-coverage [email or web address] Password [email or web address]

## 2016-11-19 NOTE — Care Management Note (Signed)
Case Management Note  Patient Details  Name: Christain SacramentoRobin L White MRN: [id number] Date of Birth: 04/02/1961    Expected Discharge Date:  11/19/16               Expected Discharge Plan:  Home/Self Care  In-House Referral:  NA  Discharge planning Services  CM Consult  Post Acute Care Choice:  Home Health Choice offered to:  Patient  DME Arranged:  Dan HumphreysWalker rolling DME Agency:  [facility]  HH [facility]  [facility]  Status of Service:  Completed, signed off  If discussed at Long Length of Stay Meetings, dates discussed:    Additional Comments: Patient discharging home today. [facility] and [facility].   Khyron Garno, Chrystine OilerSharley Diane, RN 11/19/2016, 10:48 AM

## 2016-11-19 NOTE — Progress Notes (Addendum)
Discharge instructions reviewed with patient and patient verbalized understanding. Dressing change done. Patient to be discharged via private vehicle with son. Patient discharge via wheelchair with son

## 2016-11-19 NOTE — Discharge Instructions (Signed)
Cellulitis, Adult Introduction Cellulitis is a skin infection. The infected area is usually red and sore. This condition occurs most often in the arms and lower legs. It is very important to get treated for this condition. Follow these instructions at home:  Take over-the-counter and prescription medicines only as told by your doctor.  If you were prescribed an antibiotic medicine, take it as told by your doctor. Do not stop taking the antibiotic even if you start to feel better.  Drink enough fluid to keep your pee (urine) clear or pale yellow.  Do not touch or rub the infected area.  Raise (elevate) the infected area above the level of your heart while you are sitting or lying down.  Place warm or cold wet cloths (warm or cold compresses) on the infected area. Do this as told by your doctor.  Keep all follow-up visits as told by your doctor. This is important. These visits let your doctor make sure your infection is not getting worse. Contact a doctor if:  You have a fever.  Your symptoms do not get better after 1-2 days of treatment.  Your bone or joint under the infected area starts to hurt after the skin has healed.  Your infection comes back. This can happen in the same area or another area.  You have a swollen bump in the infected area.  You have new symptoms.  You feel ill and also have muscle aches and pains. Get help right away if:  Your symptoms get worse.  You feel very sleepy.  You throw up (vomit) or have watery poop (diarrhea) for a long time.  There are red streaks coming from the infected area.  Your red area gets larger.  Your red area turns darker. This information is not intended to replace advice given to you by your health care provider. Make sure you discuss any questions you have with your health care provider. Document Released: 03/19/2008 Document Revised: 03/08/2016 Document Reviewed: 08/10/2015  2017  Elsevier   Hyperglycemia Hyperglycemia is when the sugar (glucose) level in your blood is too high. It may not cause symptoms. If you do have symptoms, they may include warning signs, such as:  Feeling more thirsty than normal.  Hunger.  Feeling tired.  Needing to pee (urinate) more than normal.  Blurry eyesight (vision). You may get other symptoms as it gets worse, such as:  Dry mouth.  Not being hungry (loss of appetite).  Fruity-smelling breath.  Weakness.  Weight gain or loss that is not planned. Weight loss may be fast.  A tingling or numb feeling in your hands or feet.  Headache.  Skin that does not bounce back quickly when it is lightly pinched and released (poor skin turgor).  Pain in your belly (abdomen).  Cuts or bruises that heal slowly. High blood sugar can happen to people who do or do not have diabetes. High blood sugar can happen slowly or quickly, and it can be an emergency. Follow these instructions at home: General instructions  Take over-the-counter and prescription medicines only as told by your doctor.  Do not use products that contain nicotine or tobacco, such as cigarettes and e-cigarettes. If you need help quitting, ask your doctor.  Limit alcohol intake to no more than 1 drink per day for nonpregnant women and 2 drinks per day for men. One drink equals 12 oz of beer, 5 oz of wine, or 1 oz of hard liquor.  Manage stress. If you need help with  this, ask your doctor.  Keep all follow-up visits as told by your doctor. This is important. Eating and drinking  Stay at a healthy weight.  Exercise regularly, as told by your doctor.  Drink enough fluid, especially when you:  Exercise.  Get sick.  Are in hot temperatures.  Eat healthy foods, such as:  Low-fat (lean) proteins.  Complex carbs (complex carbohydrates), such as whole wheat bread or brown rice.  Fresh fruits and vegetables.  Low-fat dairy products.  Healthy  fats.  Drink enough fluid to keep your pee (urine) clear or pale yellow. If you have diabetes:  Make sure you know the symptoms of hyperglycemia.  Follow your diabetes management plan, as told by your doctor. Make sure you:  Take insulin and medicines as told.  Follow your exercise plan.  Follow your meal plan. Eat on time. Do not skip meals.  Check your blood sugar as often as told. Make sure to check before and after exercise. If you exercise longer or in a different way than you normally do, check your blood sugar more often.  Follow your sick day plan whenever you cannot eat or drink normally. Make this plan ahead of time with your doctor.  Share your diabetes management plan with people in your workplace, school, and household.  Check your urine for ketones when you are ill and as told by your doctor.  Carry a card or wear jewelry that says that you have diabetes. Contact a doctor if:  Your blood sugar level is higher than 240 mg/dL (16.1 mmol/L) for 2 days in a row.  You have problems keeping your blood sugar in your target range.  High blood sugar happens often for you. Get help right away if:  You have trouble breathing.  You have a change in how you think, feel, or act (mental status).  You feel sick to your stomach (nauseous), and that feeling does not go away.  You cannot stop throwing up (vomiting). These symptoms may be an emergency. Do not wait to see if the symptoms will go away. Get medical help right away. Call your local emergency services (911 in the [place] Do not drive yourself to the hospital.  Summary  Hyperglycemia is when the sugar (glucose) level in your blood is too high.  High blood sugar can happen to people who do or do not have diabetes.  Make sure you drink enough fluids, eat healthy foods, and exercise regularly.  Contact your doctor if you have problems keeping your blood sugar in your target range. This information is not intended to  replace advice given to you by your health care provider. Make sure you discuss any questions you have with your health care provider. Document Released: 07/29/2009 Document Revised: 06/18/2016 Document Reviewed: 06/18/2016 Elsevier Interactive Patient Education  2017 [employer]     Blood Glucose Monitoring, Adult Monitoring your blood sugar (glucose) helps you manage your diabetes. It also helps you and your health care provider determine how well your diabetes management plan is working. Blood glucose monitoring involves checking your blood glucose as often as directed, and keeping a record (log) of your results over time. Why should I monitor my blood glucose? Checking your blood glucose regularly can:  Help you understand how food, exercise, illnesses, and medicines affect your blood glucose.  Let you know what your blood glucose is at any time. You can quickly tell if you are having low blood glucose (hypoglycemia) or high blood glucose (hyperglycemia).  Help you and your health care provider adjust your medicines as needed. When should I check my blood glucose? Follow instructions from your health care provider about how often to check your blood glucose. This may depend on:  The type of diabetes you have.  How well-controlled your diabetes is.  Medicines you are taking. If you have type 1 diabetes:  Check your blood glucose at least 2 times a day.  Also check your blood glucose:  Before every insulin injection.  Before and after exercise.  Between meals.  2 hours after a meal.  Occasionally between 2:00 a.m. and 3:00 a.m., as directed.  Before potentially dangerous tasks, like driving or using heavy machinery.  At bedtime.  You may need to check your blood glucose more often, up to 6-10 times a day:  If you use an insulin pump.  If you need multiple daily injections (MDI).  If your diabetes is not well-controlled.  If you are ill.  If you have a  history of severe hypoglycemia.  If you have a history of not knowing when your blood glucose is getting low (hypoglycemia unawareness). If you have type 2 diabetes:  If you take insulin or other diabetes medicines, check your blood glucose at least 2 times a day.  If you are on intensive insulin therapy, check your blood glucose at least 4 times a day. Occasionally, you may also need to check between 2:00 a.m. and 3:00 a.m., as directed.  Also check your blood glucose:  Before and after exercise.  Before potentially dangerous tasks, like driving or using heavy machinery.  You may need to check your blood glucose more often if:  Your medicine is being adjusted.  Your diabetes is not well-controlled.  You are ill. What is a blood glucose log?  A blood glucose log is a record of your blood glucose readings. It helps you and your health care provider:  Look for patterns in your blood glucose over time.  Adjust your diabetes management plan as needed.  Every time you check your blood glucose, write down your result and notes about things that may be affecting your blood glucose, such as your diet and exercise for the day.  Most glucose meters store a record of glucose readings in the meter. Some meters allow you to download your records to a computer. How do I check my blood glucose? Follow these steps to get accurate readings of your blood glucose: Supplies needed   Blood glucose meter.  Test strips for your meter. Each meter has its own strips. You must use the strips that come with your meter.  A needle to prick your finger (lancet). Do not use lancets more than once.  A device that holds the lancet (lancing device).  A journal or log book to write down your results. Procedure  Wash your hands with soap and water.  Prick the side of your finger (not the tip) with the lancet. Use a different finger each time.  Gently rub the finger until a small drop of blood  appears.  Follow instructions that come with your meter for inserting the test strip, applying blood to the strip, and using your blood glucose meter.  Write down your result and any notes. Alternative testing sites  Some meters allow you to use areas of your body other than your finger (alternative sites) to test your blood.  If you think you may have hypoglycemia, or if you have hypoglycemia unawareness, do not use alternative  sites. Use your finger instead.  Alternative sites may not be as accurate as the fingers, because blood flow is slower in these areas. This means that the result you get may be delayed, and it may be different from the result that you would get from your finger.  The most common alternative sites are:  Forearm.  Thigh.  Palm of the hand. Additional tips  Always keep your supplies with you.  If you have questions or need help, all blood glucose meters have a 24-hour hotline number that you can call. You may also contact your health care provider.  After you use a few boxes of test strips, adjust (calibrate) your blood glucose meter by following instructions that came with your meter. This information is not intended to replace advice given to you by your health care provider. Make sure you discuss any questions you have with your health care provider. Document Released: 10/04/2003 Document Revised: 04/20/2016 Document Reviewed: 03/12/2016 Elsevier Interactive Patient Education  2017 [employer].

## 2016-11-19 NOTE — Care Management (Signed)
[employer] [facility] [employer], would like to use local in house agency for DME needs and possilbly HH needs as well. CM will notify Alroy BailiffLinda Lothian [facility] of [facility].

## 2016-11-20 NOTE — Progress Notes (Signed)
  Progress Note   Date: 11/20/2016  Patient Name: Pamela Fry        MRN#: [id number]   The diagnosis of cellulitis is complication of diabetes mellitus       Standley Dakinslanford Mayur Duman, MD

## 2016-12-07 ENCOUNTER — Encounter (HOSPITAL_BASED_OUTPATIENT_CLINIC_OR_DEPARTMENT_OTHER): Payer: BLUE CROSS/BLUE SHIELD | Attending: Internal Medicine

## 2016-12-07 DIAGNOSIS — I10 Essential (primary) hypertension: Secondary | ICD-10-CM | POA: Insufficient documentation

## 2016-12-07 DIAGNOSIS — Z872 Personal history of diseases of the skin and subcutaneous tissue: Secondary | ICD-10-CM | POA: Insufficient documentation

## 2016-12-07 DIAGNOSIS — Z09 Encounter for follow-up examination after completed treatment for conditions other than malignant neoplasm: Secondary | ICD-10-CM | POA: Diagnosis not present

## 2016-12-14 ENCOUNTER — Encounter (HOSPITAL_BASED_OUTPATIENT_CLINIC_OR_DEPARTMENT_OTHER): Payer: BLUE CROSS/BLUE SHIELD | Attending: Internal Medicine

## 2016-12-14 DIAGNOSIS — Z8631 Personal history of diabetic foot ulcer: Secondary | ICD-10-CM | POA: Diagnosis not present

## 2016-12-14 DIAGNOSIS — I10 Essential (primary) hypertension: Secondary | ICD-10-CM | POA: Insufficient documentation

## 2016-12-14 DIAGNOSIS — E119 Type 2 diabetes mellitus without complications: Secondary | ICD-10-CM | POA: Insufficient documentation

## 2016-12-14 DIAGNOSIS — Z09 Encounter for follow-up examination after completed treatment for conditions other than malignant neoplasm: Secondary | ICD-10-CM | POA: Insufficient documentation

## 2020-11-28 ENCOUNTER — Other Ambulatory Visit (HOSPITAL_COMMUNITY): Payer: Self-pay | Admitting: Internal Medicine

## 2020-11-28 DIAGNOSIS — Z1231 Encounter for screening mammogram for malignant neoplasm of breast: Secondary | ICD-10-CM

## 2020-12-01 ENCOUNTER — Other Ambulatory Visit: Payer: Self-pay

## 2020-12-01 ENCOUNTER — Ambulatory Visit (HOSPITAL_COMMUNITY)
Admission: RE | Admit: 2020-12-01 | Discharge: 2020-12-01 | Disposition: A | Discharge status: Home / Self Care | Payer: 59 | Source: Ambulatory Visit | Attending: Internal Medicine | Admitting: Internal Medicine

## 2020-12-01 DIAGNOSIS — Z1231 Encounter for screening mammogram for malignant neoplasm of breast: Secondary | ICD-10-CM | POA: Diagnosis present

## 2021-10-25 ENCOUNTER — Other Ambulatory Visit (HOSPITAL_COMMUNITY): Payer: Self-pay | Admitting: Family Medicine

## 2021-10-25 DIAGNOSIS — Z1231 Encounter for screening mammogram for malignant neoplasm of breast: Secondary | ICD-10-CM

## 2021-12-04 ENCOUNTER — Other Ambulatory Visit: Payer: Self-pay

## 2021-12-04 ENCOUNTER — Ambulatory Visit (HOSPITAL_COMMUNITY)
Admission: RE | Admit: 2021-12-04 | Discharge: 2021-12-04 | Disposition: A | Discharge status: Home / Self Care | Payer: 59 | Source: Ambulatory Visit | Attending: Family Medicine | Admitting: Family Medicine

## 2021-12-04 DIAGNOSIS — Z1231 Encounter for screening mammogram for malignant neoplasm of breast: Secondary | ICD-10-CM | POA: Insufficient documentation

## 2021-12-05 ENCOUNTER — Other Ambulatory Visit (HOSPITAL_COMMUNITY): Payer: Self-pay | Admitting: Family Medicine

## 2021-12-06 ENCOUNTER — Other Ambulatory Visit (HOSPITAL_COMMUNITY): Payer: Self-pay | Admitting: Family Medicine

## 2021-12-06 DIAGNOSIS — R928 Other abnormal and inconclusive findings on diagnostic imaging of breast: Secondary | ICD-10-CM

## 2022-01-09 ENCOUNTER — Other Ambulatory Visit: Payer: Self-pay

## 2022-01-09 ENCOUNTER — Ambulatory Visit (HOSPITAL_COMMUNITY)
Admission: RE | Admit: 2022-01-09 | Discharge: 2022-01-09 | Disposition: A | Discharge status: Home / Self Care | Payer: 59 | Source: Ambulatory Visit | Attending: Family Medicine | Admitting: Family Medicine

## 2022-01-09 DIAGNOSIS — R928 Other abnormal and inconclusive findings on diagnostic imaging of breast: Secondary | ICD-10-CM | POA: Insufficient documentation

## 2022-01-09 DIAGNOSIS — R922 Inconclusive mammogram: Secondary | ICD-10-CM | POA: Diagnosis not present

## 2022-05-30 ENCOUNTER — Ambulatory Visit: Payer: Self-pay | Admitting: Family Medicine

## 2022-08-01 DIAGNOSIS — E7800 Pure hypercholesterolemia, unspecified: Secondary | ICD-10-CM | POA: Diagnosis not present

## 2022-08-01 DIAGNOSIS — Z0000 Encounter for general adult medical examination without abnormal findings: Secondary | ICD-10-CM | POA: Diagnosis not present

## 2022-08-01 DIAGNOSIS — R5383 Other fatigue: Secondary | ICD-10-CM | POA: Diagnosis not present

## 2022-08-01 DIAGNOSIS — Z79899 Other long term (current) drug therapy: Secondary | ICD-10-CM | POA: Diagnosis not present

## 2022-08-18 IMAGING — MG MM DIGITAL DIAGNOSTIC UNILAT*R* W/ TOMO W/ CAD
6 of 10 series · 6 of 30 positions shown · non-contrast
Comparison: Previous exams.

CLINICAL DATA: Screening recall for possible right breast
asymmetry.

EXAM:
DIGITAL DIAGNOSTIC UNILATERAL RIGHT MAMMOGRAM WITH TOMOSYNTHESIS AND
CAD; ULTRASOUND RIGHT BREAST LIMITED
TECHNIQUE: Right digital diagnostic mammography and breast tomosynthesis was
performed. The images were evaluated with computer-aided detection.;
Targeted ultrasound examination of the right breast was performed

[R ML synth-2D (1 of 2)]
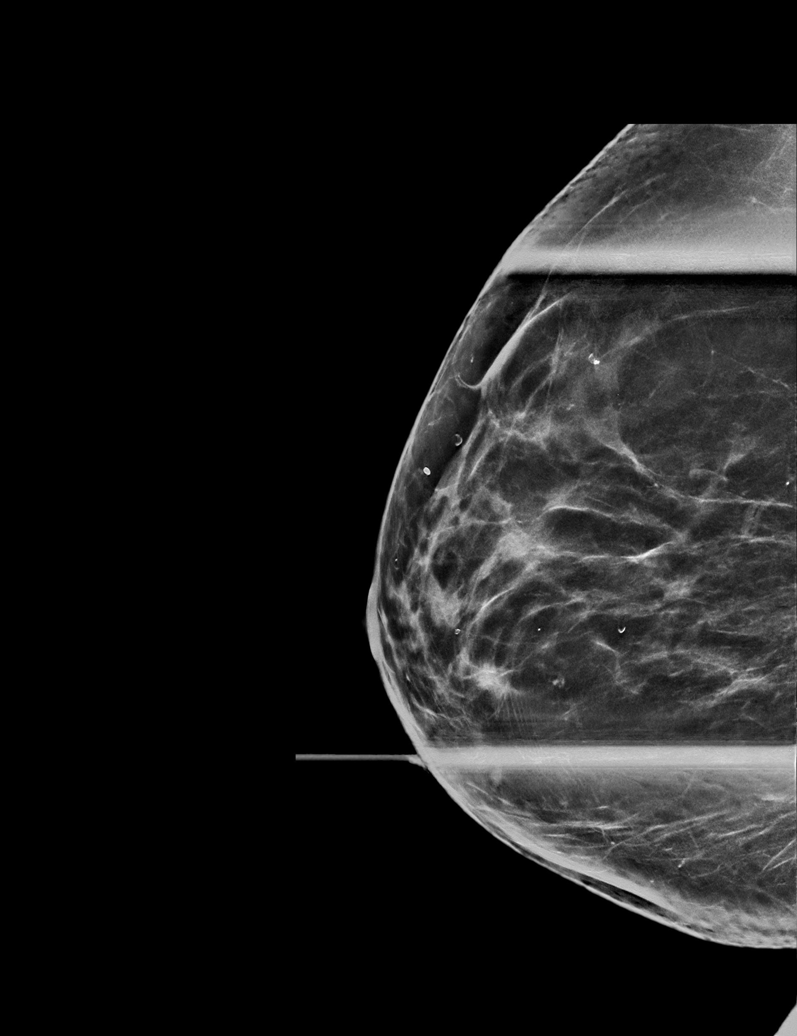

[R ML synth-2D (2 of 2)]
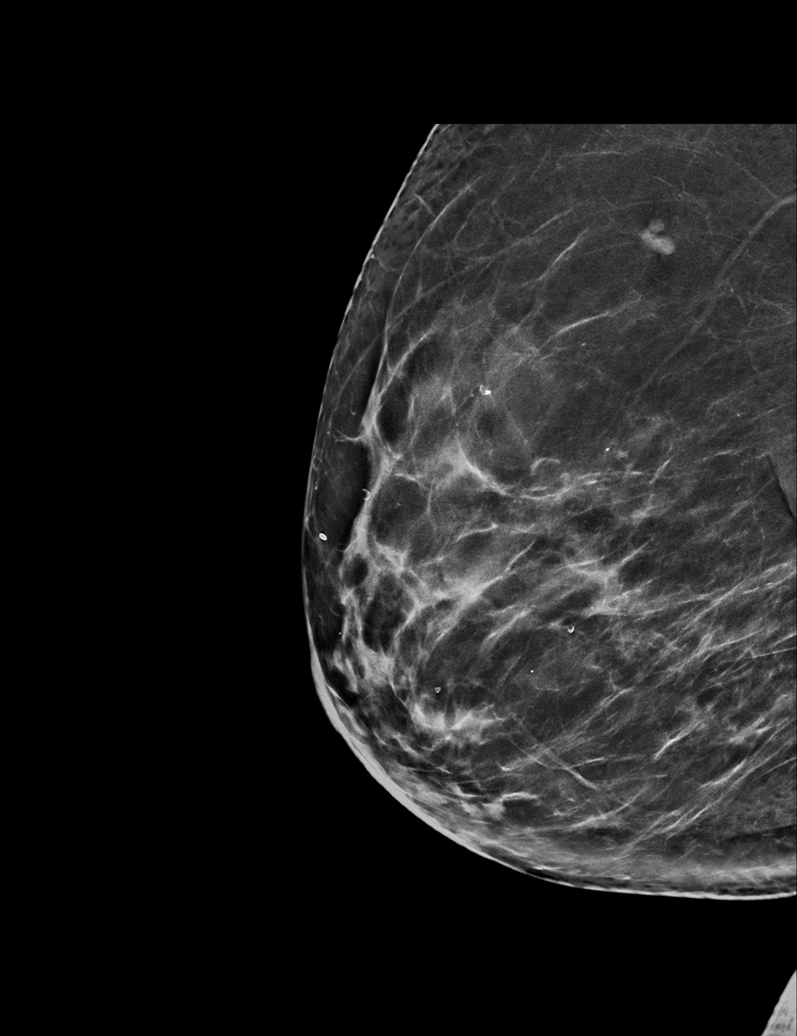

[R CC synth-2D (1 of 2)]
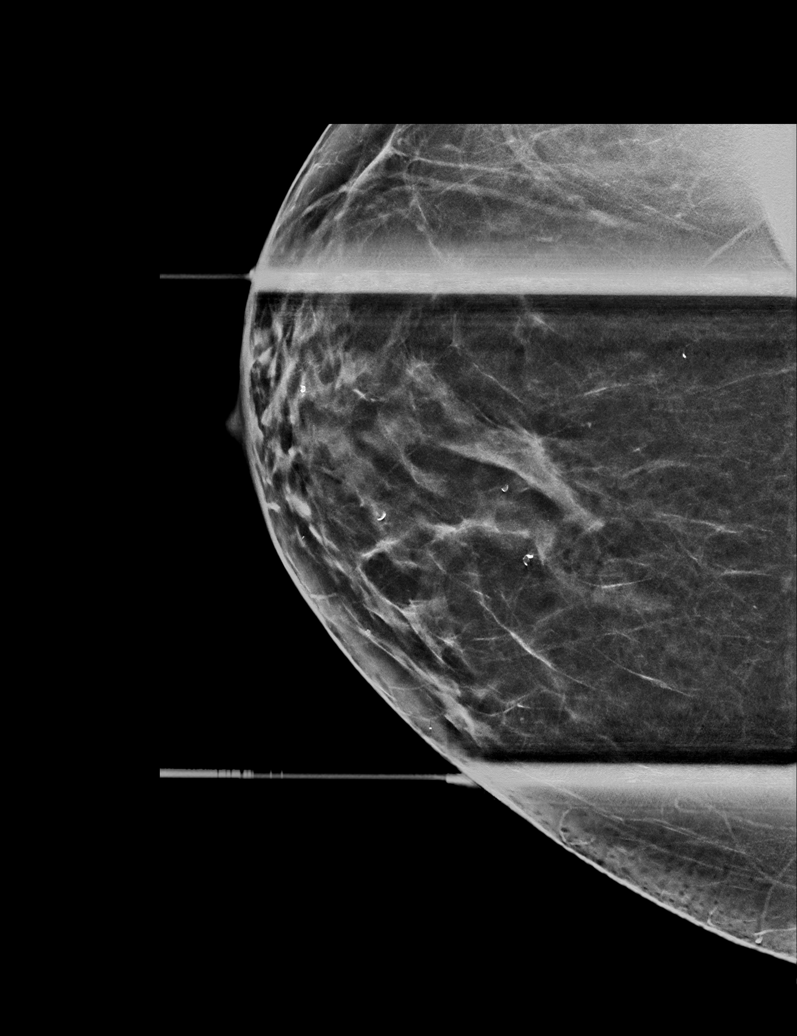

[R MLO synth-2D]
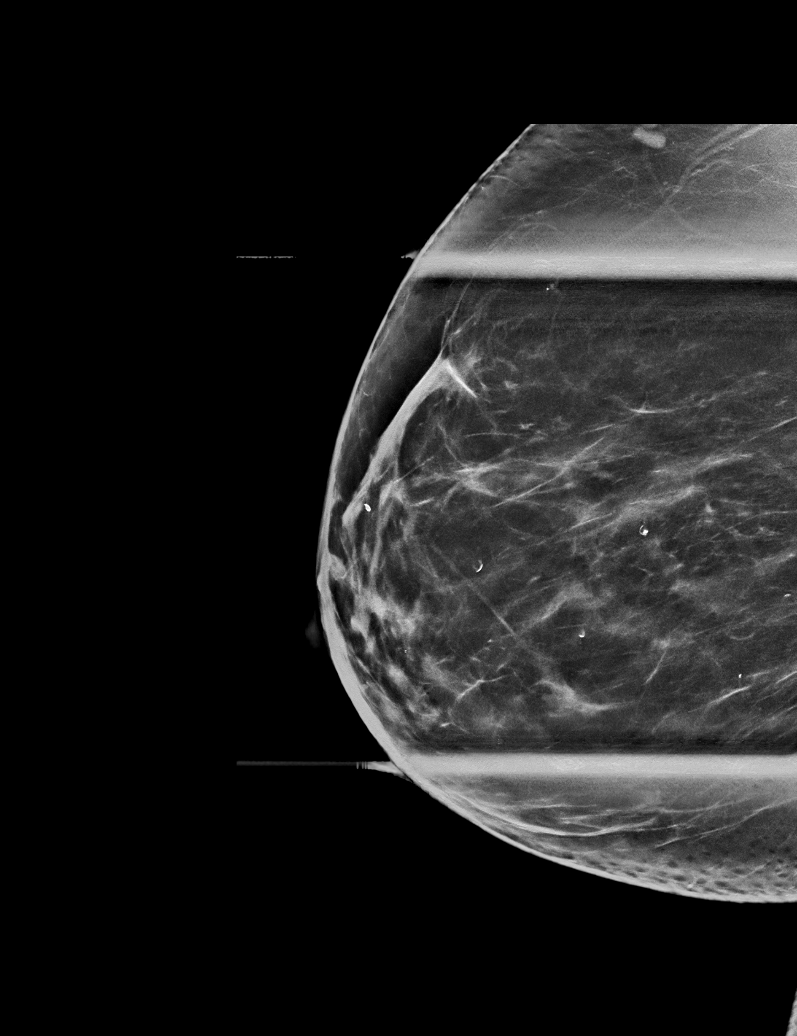

[R CC synth-2D (2 of 2)]
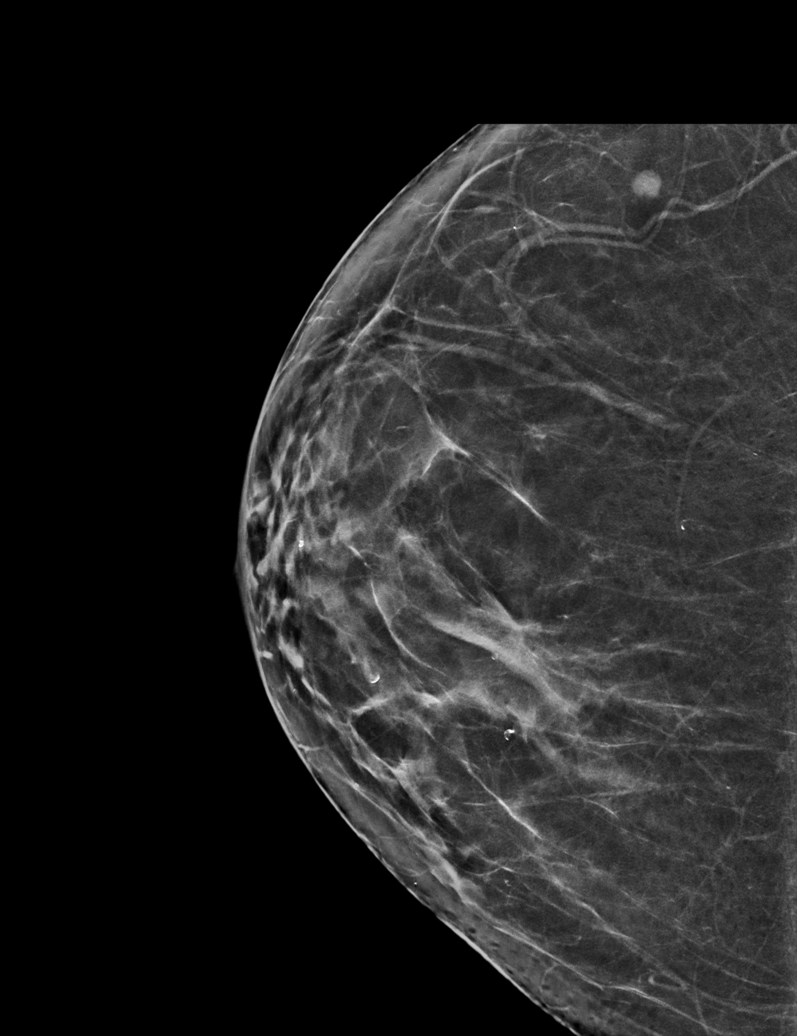

[R CC tomo · tomo slice 31/62.0]
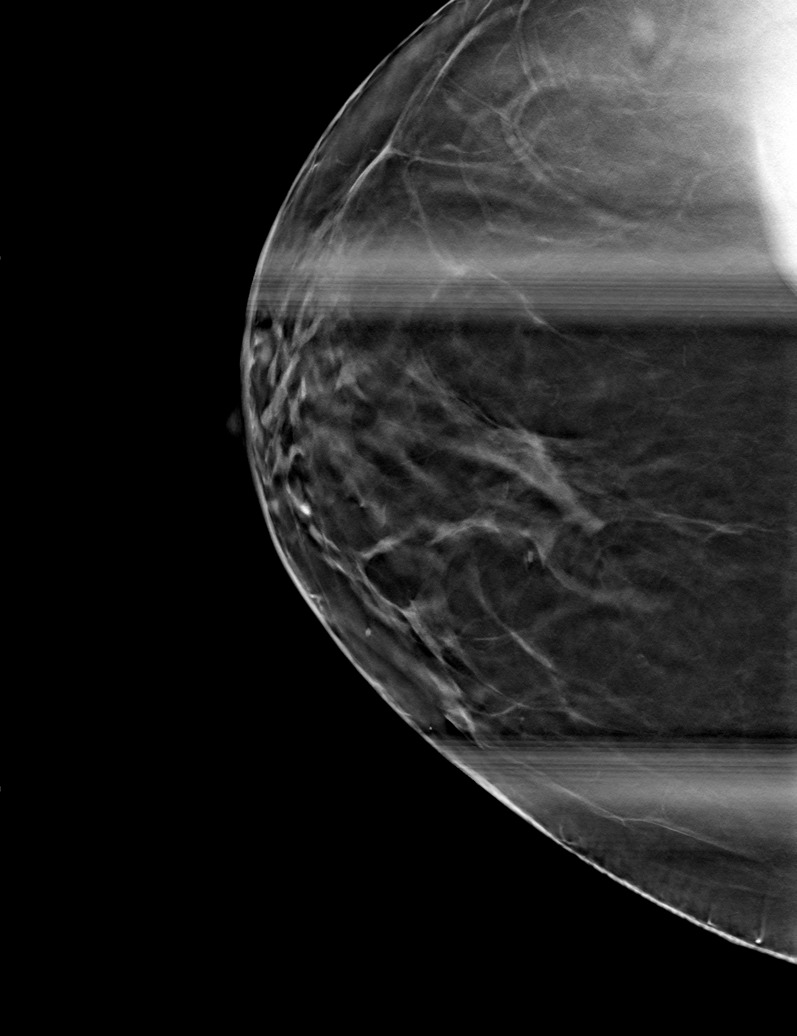

[6 of 30 positions shown; findings below may reference images not displayed]

ACR Breast Density Category b: There are scattered areas of
fibroglandular density.
FINDINGS: Additional tomograms were performed of the right. The initially
questioned possible right breast asymmetry is felt to resolve on the
additional imaging with findings suggestive of an area
overlapping/dense fibroglandular tissue.

Targeted ultrasound the upper central and inner right breast was
performed. No suspicious masses or abnormality seen, only
heterogeneous fibroglandular tissue identified.
IMPRESSION: No findings of malignancy in the right breast.

RECOMMENDATION:
Screening mammogram in one year.(Code:IG-[id number])

I have discussed the findings and recommendations with the patient.
If applicable, a reminder letter will be sent to the patient
regarding the next appointment.

BI-RADS CATEGORY  1: Negative.

## 2022-12-06 ENCOUNTER — Other Ambulatory Visit (HOSPITAL_COMMUNITY): Payer: Self-pay | Admitting: Internal Medicine

## 2022-12-06 DIAGNOSIS — Z1231 Encounter for screening mammogram for malignant neoplasm of breast: Secondary | ICD-10-CM

## 2022-12-10 DIAGNOSIS — E1165 Type 2 diabetes mellitus with hyperglycemia: Secondary | ICD-10-CM | POA: Diagnosis not present

## 2022-12-10 DIAGNOSIS — E1122 Type 2 diabetes mellitus with diabetic chronic kidney disease: Secondary | ICD-10-CM | POA: Diagnosis not present

## 2022-12-10 DIAGNOSIS — Z6841 Body Mass Index (BMI) 40.0 and over, adult: Secondary | ICD-10-CM | POA: Diagnosis not present

## 2022-12-10 DIAGNOSIS — I10 Essential (primary) hypertension: Secondary | ICD-10-CM | POA: Diagnosis not present

## 2022-12-10 DIAGNOSIS — E1129 Type 2 diabetes mellitus with other diabetic kidney complication: Secondary | ICD-10-CM | POA: Diagnosis not present

## 2022-12-10 DIAGNOSIS — Z299 Encounter for prophylactic measures, unspecified: Secondary | ICD-10-CM | POA: Diagnosis not present

## 2022-12-14 ENCOUNTER — Encounter (HOSPITAL_COMMUNITY): Payer: Self-pay

## 2022-12-14 ENCOUNTER — Ambulatory Visit (HOSPITAL_COMMUNITY)
Admission: RE | Admit: 2022-12-14 | Discharge: 2022-12-14 | Disposition: A | Discharge status: Home / Self Care | Payer: Medicaid Other | Source: Ambulatory Visit | Attending: Internal Medicine | Admitting: Internal Medicine

## 2022-12-14 DIAGNOSIS — Z1231 Encounter for screening mammogram for malignant neoplasm of breast: Secondary | ICD-10-CM | POA: Insufficient documentation

## 2023-03-15 DIAGNOSIS — E1165 Type 2 diabetes mellitus with hyperglycemia: Secondary | ICD-10-CM | POA: Diagnosis not present

## 2023-03-15 DIAGNOSIS — I10 Essential (primary) hypertension: Secondary | ICD-10-CM | POA: Diagnosis not present

## 2023-03-15 DIAGNOSIS — Z794 Long term (current) use of insulin: Secondary | ICD-10-CM | POA: Diagnosis not present

## 2023-03-15 DIAGNOSIS — Z299 Encounter for prophylactic measures, unspecified: Secondary | ICD-10-CM | POA: Diagnosis not present

## 2023-06-18 DIAGNOSIS — Z299 Encounter for prophylactic measures, unspecified: Secondary | ICD-10-CM | POA: Diagnosis not present

## 2023-06-18 DIAGNOSIS — Z794 Long term (current) use of insulin: Secondary | ICD-10-CM | POA: Diagnosis not present

## 2023-06-18 DIAGNOSIS — E1165 Type 2 diabetes mellitus with hyperglycemia: Secondary | ICD-10-CM | POA: Diagnosis not present

## 2023-06-18 DIAGNOSIS — I10 Essential (primary) hypertension: Secondary | ICD-10-CM | POA: Diagnosis not present

## 2023-09-24 DIAGNOSIS — Z299 Encounter for prophylactic measures, unspecified: Secondary | ICD-10-CM | POA: Diagnosis not present

## 2023-09-24 DIAGNOSIS — E1165 Type 2 diabetes mellitus with hyperglycemia: Secondary | ICD-10-CM | POA: Diagnosis not present

## 2023-09-24 DIAGNOSIS — I10 Essential (primary) hypertension: Secondary | ICD-10-CM | POA: Diagnosis not present

## 2023-09-24 DIAGNOSIS — Z794 Long term (current) use of insulin: Secondary | ICD-10-CM | POA: Diagnosis not present

## 2023-10-23 DIAGNOSIS — Z79899 Other long term (current) drug therapy: Secondary | ICD-10-CM | POA: Diagnosis not present

## 2023-10-23 DIAGNOSIS — E7800 Pure hypercholesterolemia, unspecified: Secondary | ICD-10-CM | POA: Diagnosis not present

## 2023-10-23 DIAGNOSIS — I10 Essential (primary) hypertension: Secondary | ICD-10-CM | POA: Diagnosis not present

## 2023-10-23 DIAGNOSIS — Z0000 Encounter for general adult medical examination without abnormal findings: Secondary | ICD-10-CM | POA: Diagnosis not present

## 2023-10-23 DIAGNOSIS — Z299 Encounter for prophylactic measures, unspecified: Secondary | ICD-10-CM | POA: Diagnosis not present

## 2023-10-23 DIAGNOSIS — Z1331 Encounter for screening for depression: Secondary | ICD-10-CM | POA: Diagnosis not present

## 2023-10-23 DIAGNOSIS — R5383 Other fatigue: Secondary | ICD-10-CM | POA: Diagnosis not present

## 2023-11-11 ENCOUNTER — Other Ambulatory Visit (HOSPITAL_COMMUNITY): Payer: Self-pay | Admitting: Internal Medicine

## 2023-11-11 DIAGNOSIS — Z1231 Encounter for screening mammogram for malignant neoplasm of breast: Secondary | ICD-10-CM

## 2023-12-16 ENCOUNTER — Ambulatory Visit (HOSPITAL_COMMUNITY)
Admission: RE | Admit: 2023-12-16 | Discharge: 2023-12-16 | Disposition: A | Discharge status: Home / Self Care | Payer: Medicaid Other | Source: Ambulatory Visit | Attending: Internal Medicine | Admitting: Internal Medicine

## 2023-12-16 ENCOUNTER — Encounter (HOSPITAL_COMMUNITY): Payer: Self-pay

## 2023-12-16 DIAGNOSIS — Z1231 Encounter for screening mammogram for malignant neoplasm of breast: Secondary | ICD-10-CM | POA: Diagnosis not present

## 2024-04-22 DIAGNOSIS — Z299 Encounter for prophylactic measures, unspecified: Secondary | ICD-10-CM | POA: Diagnosis not present

## 2024-04-22 DIAGNOSIS — I10 Essential (primary) hypertension: Secondary | ICD-10-CM | POA: Diagnosis not present

## 2024-04-22 DIAGNOSIS — E1165 Type 2 diabetes mellitus with hyperglycemia: Secondary | ICD-10-CM | POA: Diagnosis not present

## 2024-04-22 DIAGNOSIS — E7800 Pure hypercholesterolemia, unspecified: Secondary | ICD-10-CM | POA: Diagnosis not present

## 2024-08-07 DIAGNOSIS — Z299 Encounter for prophylactic measures, unspecified: Secondary | ICD-10-CM | POA: Diagnosis not present

## 2024-08-07 DIAGNOSIS — I10 Essential (primary) hypertension: Secondary | ICD-10-CM | POA: Diagnosis not present

## 2024-08-07 DIAGNOSIS — Z794 Long term (current) use of insulin: Secondary | ICD-10-CM | POA: Diagnosis not present

## 2024-08-07 DIAGNOSIS — E1122 Type 2 diabetes mellitus with diabetic chronic kidney disease: Secondary | ICD-10-CM | POA: Diagnosis not present

## 2024-08-07 DIAGNOSIS — E119 Type 2 diabetes mellitus without complications: Secondary | ICD-10-CM | POA: Diagnosis not present
